# Patient Record
Sex: Male | Born: 1995 | Race: Black or African American | Hispanic: No | Marital: Single | Smoking: Never smoker
Health system: Southern US, Community
[De-identification: ages and names within clinical notes are randomized; demographics above are authoritative.]

## PROBLEM LIST (undated history)

## (undated) DIAGNOSIS — G35 Multiple sclerosis: Secondary | ICD-10-CM

## (undated) DIAGNOSIS — E559 Vitamin D deficiency, unspecified: Secondary | ICD-10-CM

## (undated) DIAGNOSIS — E785 Hyperlipidemia, unspecified: Secondary | ICD-10-CM

## (undated) HISTORY — DX: Multiple sclerosis: G35

## (undated) HISTORY — DX: Vitamin D deficiency, unspecified: E55.9

## (undated) HISTORY — DX: Hyperlipidemia, unspecified: E78.5

---
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
## 2013-07-16 DIAGNOSIS — E559 Vitamin D deficiency, unspecified: Secondary | ICD-10-CM | POA: Insufficient documentation

## 2013-07-16 DIAGNOSIS — E785 Hyperlipidemia, unspecified: Secondary | ICD-10-CM | POA: Insufficient documentation

## 2013-07-18 ENCOUNTER — Encounter: Payer: Self-pay | Admitting: Internal Medicine

## 2014-07-23 ENCOUNTER — Encounter: Payer: Self-pay | Admitting: Internal Medicine

## 2015-02-28 ENCOUNTER — Encounter: Payer: Self-pay | Admitting: Internal Medicine

## 2015-02-28 ENCOUNTER — Ambulatory Visit (INDEPENDENT_AMBULATORY_CARE_PROVIDER_SITE_OTHER): Payer: 59 | Admitting: Internal Medicine

## 2015-02-28 VITALS — BP 122/80 | HR 72 | Temp 97.9°F | Resp 16 | Ht 68.0 in | Wt 169.4 lb

## 2015-02-28 DIAGNOSIS — R7309 Other abnormal glucose: Secondary | ICD-10-CM

## 2015-02-28 DIAGNOSIS — Z6825 Body mass index (BMI) 25.0-25.9, adult: Secondary | ICD-10-CM

## 2015-02-28 DIAGNOSIS — Z111 Encounter for screening for respiratory tuberculosis: Secondary | ICD-10-CM

## 2015-02-28 DIAGNOSIS — E785 Hyperlipidemia, unspecified: Secondary | ICD-10-CM

## 2015-02-28 DIAGNOSIS — E559 Vitamin D deficiency, unspecified: Secondary | ICD-10-CM

## 2015-02-28 DIAGNOSIS — Z0001 Encounter for general adult medical examination with abnormal findings: Secondary | ICD-10-CM | POA: Diagnosis not present

## 2015-02-28 DIAGNOSIS — R6889 Other general symptoms and signs: Secondary | ICD-10-CM

## 2015-02-28 LAB — HEPATIC FUNCTION PANEL
ALBUMIN: 4.7 g/dL (ref 3.6–5.1)
ALT: 14 U/L (ref 9–46)
AST: 10 U/L (ref 10–40)
Alkaline Phosphatase: 54 U/L (ref 40–115)
BILIRUBIN TOTAL: 0.5 mg/dL (ref 0.2–1.2)
Bilirubin, Direct: 0.1 mg/dL (ref <=0.2)
Indirect Bilirubin: 0.4 mg/dL (ref 0.2–1.2)
Total Protein: 7.2 g/dL (ref 6.1–8.1)

## 2015-02-28 LAB — BASIC METABOLIC PANEL WITH GFR
BUN: 14 mg/dL (ref 7–25)
CO2: 25 mmol/L (ref 20–31)
CREATININE: 1.03 mg/dL (ref 0.60–1.35)
Calcium: 9.2 mg/dL (ref 8.6–10.3)
Chloride: 106 mmol/L (ref 98–110)
GFR, Est Non African American: >89 mL/min (ref >=60)
Glucose, Bld: 84 mg/dL (ref 65–99)
POTASSIUM: 4.1 mmol/L (ref 3.5–5.3)
Sodium: 142 mmol/L (ref 135–146)

## 2015-02-28 LAB — CBC WITH DIFFERENTIAL/PLATELET
BASOS PCT: 0 % (ref 0–1)
Basophils Absolute: 0 10*3/uL (ref 0.0–0.1)
EOS ABS: 0.3 10*3/uL (ref 0.0–0.7)
Eosinophils Relative: 6 % — ABNORMAL HIGH (ref 0–5)
HCT: 42.5 % (ref 39.0–52.0)
Hemoglobin: 15.1 g/dL (ref 13.0–17.0)
Lymphocytes Relative: 57 % — ABNORMAL HIGH (ref 12–46)
Lymphs Abs: 2.6 10*3/uL (ref 0.7–4.0)
MCH: 27.9 pg (ref 26.0–34.0)
MCHC: 35.5 g/dL (ref 30.0–36.0)
MCV: 78.4 fL (ref 78.0–100.0)
MPV: 9.4 fL (ref 8.6–12.4)
Monocytes Absolute: 0.4 10*3/uL (ref 0.1–1.0)
Monocytes Relative: 9 % (ref 3–12)
Neutro Abs: 1.3 10*3/uL — ABNORMAL LOW (ref 1.7–7.7)
Neutrophils Relative %: 28 % — ABNORMAL LOW (ref 43–77)
Platelets: 269 10*3/uL (ref 150–400)
RBC: 5.42 MIL/uL (ref 4.22–5.81)
RDW: 14.3 % (ref 11.5–15.5)
WBC: 4.6 10*3/uL (ref 4.0–10.5)

## 2015-02-28 LAB — LIPID PANEL
CHOLESTEROL: 287 mg/dL — AB (ref 125–200)
HDL: 43 mg/dL (ref >=40)
LDL Cholesterol: 209 mg/dL — ABNORMAL HIGH (ref <130)
Total CHOL/HDL Ratio: 6.7 Ratio — ABNORMAL HIGH (ref <=5.0)
Triglycerides: 177 mg/dL — ABNORMAL HIGH (ref <150)
VLDL: 35 mg/dL — ABNORMAL HIGH (ref <30)

## 2015-02-28 LAB — TSH: TSH: 3.243 u[IU]/mL (ref 0.350–4.500)

## 2015-02-28 NOTE — Patient Instructions (Addendum)
High Cholesterol High cholesterol refers to having a high level of cholesterol in your blood. Cholesterol is a white, waxy, fat-like protein that your body needs in small amounts. Your liver makes all the cholesterol you need. Excess cholesterol comes from the food you eat. Cholesterol travels in your bloodstream through your blood vessels. If you have high cholesterol, deposits (plaque) may build up on the walls of your blood vessels. This makes the arteries narrower and stiffer. Plaque increases your risk of heart attack and stroke. Work with your health care provider to keep your cholesterol levels in a healthy range. RISK FACTORS Several things can make you more likely to have high cholesterol. These include:   Eating foods high in animal fat (saturated fat) or cholesterol.  Being overweight.  Not getting enough exercise.  Having a family history of high cholesterol. SIGNS AND SYMPTOMS High cholesterol does not cause symptoms. DIAGNOSIS  Your health care provider can do a blood test to check whether you have high cholesterol. If you are older than 20, your health care provider may check your cholesterol every 4-6 years. You may be checked more often if you already have high cholesterol or other risk factors for heart disease. The blood test for cholesterol measures the following:  Bad cholesterol (LDL cholesterol). This is the type of cholesterol that causes heart disease. This number should be less than 100.  Good cholesterol (HDL cholesterol). This type helps protect against heart disease. A healthy level of HDL cholesterol is 60 or higher.  Total cholesterol. This is the combined number of LDL cholesterol and HDL cholesterol. A healthy number is less than 200. TREATMENT  High cholesterol can be treated with diet changes, lifestyle changes, and medicine.   Diet changes may include eating more whole grains, fruits, vegetables, nuts, and fish. You may also have to cut back on red meat  and foods with a lot of added sugar.  Lifestyle changes may include getting at least 40 minutes of aerobic exercise three times a week. Aerobic exercises include walking, biking, and swimming. Aerobic exercise along with a healthy diet can help you maintain a healthy weight. Lifestyle changes may also include quitting smoking.  If diet and lifestyle changes are not enough to lower your cholesterol, your health care provider may prescribe a statin medicine. This medicine has been shown to lower cholesterol and also lower the risk of heart disease. HOME CARE INSTRUCTIONS  Only take over-the-counter or prescription medicines as directed by your health care provider.   Follow a healthy diet as directed by your health care provider. For instance:   Eat chicken (without skin), fish, veal, shellfish, ground [place] breast, and round or loin cuts of red meat.  Do not eat fried foods and fatty meats, such as hot dogs and salami.   Eat plenty of fruits, such as apples.   Eat plenty of vegetables, such as broccoli, potatoes, and carrots.   Eat beans, peas, and lentils.   Eat grains, such as barley, rice, couscous, and bulgur wheat.   Eat pasta without cream sauces.   Use skim or nonfat milk and low-fat or nonfat yogurt and cheeses. Do not eat or drink whole milk, cream, ice cream, egg yolks, and hard cheeses.   Do not eat stick margarine or tub margarines that contain trans fats (also called partially hydrogenated oils).   Do not eat cakes, cookies, crackers, or other baked goods that contain trans fats.   Do not eat saturated tropical oils, such as  coconut and palm oil.   Exercise as directed by your health care provider. Increase your activity level with activities such as gardening or walking.   Keep all follow-up appointments.  SEEK MEDICAL CARE IF:  You are struggling to maintain a healthy diet or weight.  You need help starting an exercise program.  You need help to  stop smoking.  Recommend Adult Low dose Aspirin or coated  Aspirin 81 mg daily   To reduce risk of Colon Cancer 20 %,   Skin Cancer 26 % ,   Melanoma 46%   and   Pancreatic cancer 60% ++++++++++++++++++ Vitamin D goal is between 70-100.   Please make sure that you are taking your Vitamin D as directed.   It is very important as a natural anti-inflammatory   helping hair, skin, and nails, as well as reducing stroke and heart attack risk.   It helps your bones and helps with mood.  It also decreases numerous cancer risks so please take it as directed.   Low Vit D is associated with a 200-300% higher risk for CANCER   and 200-300% higher risk for HEART   ATTACK  &  STROKE.   .....................................Marland Kitchen  It is also associated with higher death rate at younger ages,   autoimmune diseases like Rheumatoid arthritis, Lupus, Multiple Sclerosis.     Also many other serious conditions, like depression, Alzheimer's  Dementia, infertility, muscle aches, fatigue, fibromyalgia - just to name a few.  +++++++++++++++++++  Recommend the book "The END of DIETING" by Dr Excell Seltzer   & the book "The END of DIABETES " by Dr Excell Seltzer  At [email or web address] - get book & Audio CD's     Being diabetic has a  300% increased risk for heart attack, stroke, cancer, and alzheimer- type vascular dementia. It is very important that you work harder with diet by avoiding all foods that are white. Avoid white rice (brown & wild rice is OK), white potatoes (sweetpotatoes in moderation is OK), White bread or wheat bread or anything made out of white flour like bagels, donuts, rolls, buns, biscuits, cakes, pastries, cookies, pizza crust, and pasta (made from white flour & egg whites) - vegetarian pasta or spinach or wheat pasta is OK. Multigrain breads like Arnold's or [employer], or multigrain sandwich thins or flatbreads.  Diet, exercise and weight loss can reverse and cure diabetes in the  early stages.  Diet, exercise and weight loss is very important in the control and prevention of complications of diabetes which affects every system in your body, ie. Brain - dementia/stroke, eyes - glaucoma/blindness, heart - heart attack/heart failure, kidneys - dialysis, stomach - gastric paralysis, intestines - malabsorption, nerves - severe painful neuritis, circulation - gangrene & loss of a leg(s), and finally cancer and Alzheimers.    I recommend avoid fried & greasy foods,  sweets/candy, white rice (brown or wild rice or Quinoa is OK), white potatoes (sweet potatoes are OK) - anything made from white flour - bagels, doughnuts, rolls, buns, biscuits,white and wheat breads, pizza crust and traditional pasta made of white flour & egg white(vegetarian pasta or spinach or wheat pasta is OK).  Multi-grain bread is OK - like multi-grain flat bread or sandwich thins. Avoid alcohol in excess. Exercise is also important.    Eat all the vegetables you want - avoid meat, especially red meat and dairy - especially cheese.  Cheese is the most concentrated form of trans-fats which is the worst thing  to clog up our arteries. Veggie cheese is OK which can be found in the fresh produce section at [facility] or [employer] or [employer]  ++++++++++++++++++++++++++   Preventive Care for Adults  A healthy lifestyle and preventive care can promote health and wellness. Preventive health guidelines for men include the following key practices:  A routine yearly physical is a good way to check with your health care provider about your health and preventative screening. It is a chance to share any concerns and updates on your health and to receive a thorough exam.  Visit your dentist for a routine exam and preventative care every 6 months. Brush your teeth twice a day and floss once a day. Good oral hygiene prevents tooth decay and gum disease.  The frequency of eye exams is based on your age, health, family  medical history, use of contact lenses, and other factors. Follow your health care provider's recommendations for frequency of eye exams.  Eat a healthy diet. Foods such as vegetables, fruits, whole grains, low-fat dairy products, and lean protein foods contain the nutrients you need without too many calories. Decrease your intake of foods high in solid fats, added sugars, and salt. Eat the right amount of calories for you.Get information about a proper diet from your health care provider, if necessary.  Regular physical exercise is one of the most important things you can do for your health. Most adults should get at least 150 minutes of moderate-intensity exercise (any activity that increases your heart rate and causes you to sweat) each week. In addition, most adults need muscle-strengthening exercises on 2 or more days a week.  Maintain a healthy weight. The body mass index (BMI) is a screening tool to identify possible weight problems. It provides an estimate of body fat based on height and weight. Your health care provider can find your BMI and can help you achieve or maintain a healthy weight.For adults 20 years and older:  A BMI below 18.5 is considered underweight.  A BMI of 18.5 to 24.9 is normal.  A BMI of 25 to 29.9 is considered overweight.  A BMI of 30 and above is considered obese.  Maintain normal blood lipids and cholesterol levels by exercising and minimizing your intake of saturated fat. Eat a balanced diet with plenty of fruit and vegetables. Blood tests for lipids and cholesterol should begin at age 67 and be repeated every 5 years. If your lipid or cholesterol levels are high, you are over 50, or you are at high risk for heart disease, you may need your cholesterol levels checked more frequently.Ongoing high lipid and cholesterol levels should be treated with medicines if diet and exercise are not working.  If you smoke, find out from your health care provider how to quit.  If you do not use tobacco, do not start.  Lung cancer screening is recommended for adults aged 37-80 years who are at high risk for developing lung cancer because of a history of smoking. A yearly low-dose CT scan of the lungs is recommended for people who have at least a 30-pack-year history of smoking and are a current smoker or have quit within the past 15 years. A pack year of smoking is smoking an average of 1 pack of cigarettes a day for 1 year (for example: 1 pack a day for 30 years or 2 packs a day for 15 years). Yearly screening should continue until the smoker has stopped smoking for at least 15 years. Yearly screening  should be stopped for people who develop a health problem that would prevent them from having lung cancer treatment.  If you choose to drink alcohol, do not have more than 2 drinks per day. One drink is considered to be 12 ounces (355 mL) of beer, 5 ounces (148 mL) of wine, or 1.5 ounces (44 mL) of liquor.  High blood pressure causes heart disease and increases the risk of stroke. Your blood pressure should be checked. Ongoing high blood pressure should be treated with medicines, if weight loss and exercise are not effective.  If you are 21-64 years old, ask your health care provider if you should take aspirin to prevent heart disease.  Diabetes screening involves taking a blood sample to check your fasting blood sugar level. Testing should be considered at a younger age or be carried out more frequently if you are overweight and have at least 1 risk factor for diabetes.  Colorectal cancer can be detected and often prevented. Most routine colorectal cancer screening begins at the age of 43 and continues through age 71. However, your health care provider may recommend screening at an earlier age if you have risk factors for colon cancer. On a yearly basis, your health care provider may provide home test kits to check for hidden blood in the stool. Use of a small camera at the end  of a tube to directly examine the colon (sigmoidoscopy or colonoscopy) can detect the earliest forms of colorectal cancer. Talk to your health care provider about this at age 50, when routine screening begins. Direct exam of the colon should be repeated every 5-10 years through age 33, unless early forms of precancerous polyps or small growths are found.  Screening for abdominal aortic aneurysm (AAA)  are recommended for persons over age 85 who have history of hypertensionor who are current or former smokers.  Talk with your health care provider about prostate cancer screening.  Testicular cancer screening is recommended for adult males. Screening includes self-exam, a health care provider exam, and other screening tests. Consult with your health care provider about any symptoms you have or any concerns you have about testicular cancer.  Use sunscreen. Apply sunscreen liberally and repeatedly throughout the day. You should seek shade when your shadow is shorter than you. Protect yourself by wearing long sleeves, pants, a wide-brimmed hat, and sunglasses year round, whenever you are outdoors.  Once a month, do a whole-body skin exam, using a mirror to look at the skin on your back. Tell your health care provider about new moles, moles that have irregular borders, moles that are larger than a pencil eraser, or moles that have changed in shape or color.  Stay current with required vaccines (immunizations).  Influenza vaccine. All adults should be immunized every year.  Tetanus, diphtheria, and acellular pertussis (Td, Tdap) vaccine. An adult who has not previously received Tdap or who does not know his vaccine status should receive 1 dose of Tdap. This initial dose should be followed by tetanus and diphtheria toxoids (Td) booster doses every 10 years. Adults with an unknown or incomplete history of completing a 3-dose immunization series with Td-containing vaccines should begin or complete a primary  immunization series including a Tdap dose. Adults should receive a Td booster every 10 years.  Zoster vaccine. One dose is recommended for adults aged 97 years or older unless certain conditions are present.    Pneumococcal 13-valent conjugate (PCV13) vaccine. When indicated, a person who is uncertain of his immunization  history and has no record of immunization should receive the PCV13 vaccine. An adult aged 68 years or older who has certain medical conditions and has not been previously immunized should receive 1 dose of PCV13 vaccine. This PCV13 should be followed with a dose of pneumococcal polysaccharide (PPSV23) vaccine. The PPSV23 vaccine dose should be obtained at least 8 weeks after the dose of PCV13 vaccine. An adult aged 67 years or older who has certain medical conditions and previously received 1 or more doses of PPSV23 vaccine should receive 1 dose of PCV13. The PCV13 vaccine dose should be obtained 1 or more years after the last PPSV23 vaccine dose.    Pneumococcal polysaccharide (PPSV23) vaccine. When PCV13 is also indicated, PCV13 should be obtained first. All adults aged 58 years and older should be immunized. An adult younger than age 70 years who has certain medical conditions should be immunized. Any person who resides in a nursing home or long-term care facility should be immunized. An adult smoker should be immunized. People with an immunocompromised condition and certain other conditions should receive both PCV13 and PPSV23 vaccines. People with human immunodeficiency virus (HIV) infection should be immunized as soon as possible after diagnosis. Immunization during chemotherapy or radiation therapy should be avoided. Routine use of PPSV23 vaccine is not recommended for American Indians, [place] Natives, or people younger than 65 years unless there are medical conditions that require PPSV23 vaccine. When indicated, people who have unknown immunization and have no record of  immunization should receive PPSV23 vaccine. One-time revaccination 5 years after the first dose of PPSV23 is recommended for people aged 19-64 years who have chronic kidney failure, nephrotic syndrome, asplenia, or immunocompromised conditions. People who received 1-2 doses of PPSV23 before age 44 years should receive another dose of PPSV23 vaccine at age 37 years or later if at least 5 years have passed since the previous dose. Doses of PPSV23 are not needed for people immunized with PPSV23 at or after age 4 years.  Hepatitis A vaccine. Adults who wish to be protected from this disease, have certain high-risk conditions, work with hepatitis A-infected animals, work in hepatitis A research labs, or travel to or work in countries with a high rate of hepatitis A should be immunized. Adults who were previously unvaccinated and who anticipate close contact with an international adoptee during the first 60 days after arrival in the [place] from a country with a high rate of hepatitis A should be immunized.  Hepatitis B vaccine. Adults should be immunized if they wish to be protected from this disease, have certain high-risk conditions, may be exposed to blood or other infectious body fluids, are household contacts or sex partners of hepatitis B positive people, are clients or workers in certain care facilities, or travel to or work in countries with a high rate of hepatitis B.  Preventive Service / Frequency  Ages 50 to 55  Blood pressure check.  Lipid and cholesterol check.  Hepatitis C blood test.** / For any individual with known risks for hepatitis C.  Skin self-exam. / Monthly.  Influenza vaccine. / Every year.  Tetanus, diphtheria, and acellular pertussis (Tdap, Td) vaccine.** / Consult your health care provider. 1 dose of Td every 10 years.  HPV vaccine. / 3 doses over 6 months, if 50 or younger.  Measles, mumps, rubella (MMR) vaccine.** / You need at least 1 dose of MMR if you were  born in 1957 or later. You may also need a second dose.  Pneumococcal 13-valent conjugate (PCV13) vaccine.** / Consult your health care provider.  Pneumococcal polysaccharide (PPSV23) vaccine.** / 1 to 2 doses if you smoke cigarettes or if you have certain conditions.  Meningococcal vaccine.** / 1 dose if you are age 34 to 61 years and a Market researcher living in a residence hall, or have one of several medical conditions. You may also need additional booster doses.  Hepatitis A vaccine.** / Consult your health care provider.  Hepatitis B vaccine.** / Consult your health care provider.   Atherosclerosis Atherosclerosis, or hardening of the arteries, is the buildup of plaque within the major arteries in the body. Plaque is made up of fats (lipids), cholesterol, calcium, and fibrous tissue. Plaque can narrow or block blood flow within an artery. Plaque can break off and cause damage to the affected organ. Plaque can also "rupture." When plaque ruptures within an artery, a clot can form, causing a sudden (acute) blockage of the artery. Untreated atherosclerosis can cause serious health problems or death.  RISK FACTORS  High cholesterol levels.  Smoking.  Obesity.  Lack of activity or exercise.  Eating a diet high in saturated fat.  Family history.  Diabetes. SIGNS AND SYMPTOMS  Symptoms of atherosclerosis can occur when blood flow to an artery is slowed or blocked. Severity and onset of symptoms depends on how extensive the narrowing or blockage is. A sudden plaque rupture can bring immediate, life-threatening symptoms. Atherosclerosis can affect different arteries in the body, for example:  Coronary arteries. The coronary arteries supply the heart with blood. When the coronary arteries are narrowed or blocked from atherosclerosis, this is known as coronary artery disease (CAD). CAD can cause a heart attack. Common heart attack symptoms include:  Chest pain or pain that  radiates to the neck, arm, jaw, or in the upper, middle back (mid-scapular pain).  Shortness of breath without cause.  Profuse sweating while at rest.  Irregular heartbeats.  Nausea or gastrointestinal upset.  Carotid arteries. The carotid arteries supply the brain with blood. They are located on each side of your neck. When blood flow to these arteries is slowed or blocked, a transient ischemic attack (TIA) or stroke can occur. A TIA is considered a "mini-stroke" or "warning stroke." TIA symptoms are the same as stroke symptoms, but they are temporary and last less than 24 hours. A stroke can cause permanent damage or death. Common TIA and stroke symptoms include:  Sudden numbness or weakness to one side of your body, such as the face, arm, or leg.  Sudden confusion or trouble speaking or understanding.  Sudden trouble seeing out of one or both eyes.  Sudden trouble walking, loss of balance, or dizziness.  Sudden, severe headache with no known cause.  Arteries in the legs. When arteries in the lower legs become narrowed or blocked, this is known as peripheral vascular disease (PVD). PVD can cause a symptom called claudication. Claudication is pain or a burning feeling in your legs when walking or exercising and usually goes away with rest. Very severe PVD can cause pain in your legs while at rest.  Renal arteries. The renal arteries supply the kidneys with blood. Blockage of the renal arteries can cause a decline in kidney function or high blood pressure (hypertension).  Gastrointestinal arteries (mesenteric circulation). Abdominal pain may occur after eating. DIAGNOSIS  Your health care provider may perform the following tests to diagnose atherosclerosis:  Blood tests.  Stress test.  Echocardiogram.  Nuclear scan.  Ankle/brachial index.  Ultrasonography.  Computed tomography (CT) scan.  Angiography. TREATMENT  Atherosclerosis treatment includes the  following:  Lifestyle changes such as:  Quitting smoking. Your health care provider can help you with smoking cessation.  Eating a diet low in saturated fat. A registered dietitian can educate you on healthy food options, such as helping you understand the difference between good fat and bad fat.  Following an exercise program approved by your health care provider.  Maintaining a healthy weight. Lose weight as approved by your health care provider.  Have your cholesterol levels checked as directed by your health care provider.  Medicines. Cholesterol medicines can help slow or stop the progression of atherosclerosis.  Different procedural or surgical interventions to treat atherosclerosis include:  Balloon angioplasty. The technical name for balloon angioplasty is percutaneous transluminal angioplasty (PTA). In this procedure, a catheter with a small balloon at the tip is inserted through the blocked or narrowed artery. The balloon is then inflated. When the balloon is inflated, the fatty plaque is compressed against the artery wall, allowing better blood flow within the artery.  Balloon angioplasty and stenting. In this procedure, balloon angioplasty is combined with a stenting procedure. A stent is a small, metal mesh tube that keeps the artery open. After the artery is opened up by the balloon technique, the stent is then deployed. The stent is permanent.  Open heart surgery or bypass surgery. To perform this type of surgery, a healthy vessel is first "harvested" from either the leg or arm. The harvested vessel is then used to "bypass" the blocked atherosclerotic vessel so new blood flow can be established.  Atherectomy. Atherectomy is a procedure that uses a catheter with a sharp blade to remove plaque from an artery. A chamber in the catheter collects the plaque.  Endarterectomy. An endarterectomy is a surgical procedure where a surgeon removes plaque from an artery.  Amputation.  When blockages in the lower legs are very severe and circulation cannot be restored, amputation may be required. SEEK IMMEDIATE MEDICAL CARE IF:  You are having heart attack symptoms, such as:  Chest pain or pain that radiates to the neck, arm, jaw, or in the upper, middle back (mid-scapular pain).  Shortness of breath without cause.  Profuse sweating while at rest.  Irregular heartbeats.  Nausea or gastrointestinal upset.  You are having stroke symptoms, such as sudden:  Numbness or weakness to one side of your body, such as the face, arm, or leg.  Confusion or trouble speaking or understanding.  Trouble seeing out of one or both eyes.  Trouble walking, loss of balance, or dizziness.  Severe headache with no known cause.  Your hands or feet are bluish, cold, or you have pain in them.  You have bad abdominal pain after eating. Symptoms of heart attack or stroke may represent a serious problem that is an emergency. Do not wait to see if the symptoms will go away. Get medical help right away. Call your local emergency services (911 in the [place]

## 2015-02-28 NOTE — Progress Notes (Signed)
Patient ID: Willie Archer, male   DOB: [birth year], 19 y.o.   MRN: [id number]  Annual Preventative Comprehensive Examination   This very nice 19 y.o. SBM presents for complete physical.  Patient has hx/o severe HLD with TC measuring ~300 & LDL's ~210- 2106 in 2010 & 2011. Patient has sporadically taken Crestor , but doesn't seem to comprehend the severity and consequences of his poor compliance for treatment. He does report fair attempts at low chol diet . Does not exercise.   Finally, patient has history of Vitamin D Deficiency of "7" in [address] and likewise has only taken his Vit D supplements sporadically.    Medication Sig  . Cholecalciferol (VITAMIN D PO) Take 5,000 Int'l Units by mouth daily. Taking 2 daily   No Known Allergies  Past Medical History  Diagnosis Date  . Hyperlipidemia   . Vitamin D deficiency    Immunization History  Administered Date(s) Administered  . HPV Quadrivalent 07/09/2011  . PPD Test 02/28/2015  . Tdap 02/21/2009   Past Surgical History  Procedure Laterality Date  . Achilles tendon surgery Bilateral 2007    Dr Cheryll Cockayne    Family History  Problem Relation Age of Onset  . Hypertension Mother   . Diabetes Father     Social History   Social History  . Marital Status: Single    Spouse Name: N/A  . Number of Children: N/A  . Years of Education: N/A   Occupational History  . Completed 2 yr degree at [employer] and is entering a Engineer, materials track at [employer] History Main Topics  . Smoking status: Never Smoker   . Smokeless tobacco: Never Used  . Alcohol Use: 0.0 oz/week    0 Standard drinks or equivalent per week     Comment: occasional  . Drug Use: No  . Sexual Activity: Active    ROS Constitutional: Denies fever, chills, weight loss/gain, headaches, insomnia, fatigue, night sweats, and change in appetite. Eyes: Denies redness, blurred vision, diplopia, discharge, itchy, watery eyes.  ENT: Denies discharge, congestion, post  nasal drip, epistaxis, sore throat, earache, hearing loss, dental pain, Tinnitus, Vertigo, Sinus pain, snoring.  Cardio: Denies chest pain, palpitations, irregular heartbeat, syncope, dyspnea, diaphoresis, orthopnea, PND, claudication, edema Respiratory: denies cough, dyspnea, DOE, pleurisy, hoarseness, laryngitis, wheezing.  Gastrointestinal: Denies dysphagia, heartburn, reflux, water brash, pain, cramps, nausea, vomiting, bloating, diarrhea, constipation, hematemesis, melena, hematochezia, jaundice, hemorrhoids Genitourinary: Denies dysuria, frequency, urgency, nocturia, hesitancy, discharge, hematuria, flank pain Breast: Breast lumps, nipple discharge, bleeding.  Musculoskeletal: Denies arthralgia, myalgia, stiffness, Jt. Swelling, pain, limp, and strain/sprain. Skin: Denies puritis, rash, hives, warts, acne, eczema, changing in skin lesion Neuro: Weakness, tremor, incoordination, spasms, paresthesia, pain Psychiatric: Denies confusion, memory loss, sensory loss. Endocrine: Denies change in weight, skin, hair change, nocturia, and paresthesia, diabetic polys, visual blurring, hyper /hypo glycemic episodes.  Heme/Lymph: No excessive bleeding, bruising, enlarged lymph nodes.   Physical Exam  BP 122/80 mmHg  Pulse 72  Temp(Src) 97.9 F (36.6 C)  Resp 16  Ht  (1.727 m)  Wt 169 lb 6.4 oz (76.839 kg)  BMI 25.76 kg/m2  General Appearance: Well nourished and in no apparent distress. Eyes: PERRLA, EOMs, conjunctiva no swelling or erythema, normal fundi and vessels. Sinuses: No frontal/maxillary tenderness ENT/Mouth: EACs patent / TMs  nl. Nares clear without erythema, swelling, mucoid exudates. Oral hygiene is good. No erythema, swelling, or exudate. Tongue normal, non-obstructing. Tonsils not swollen or erythematous. Hearing normal.  Neck: Supple, thyroid  normal. No bruits, nodes or JVD. Respiratory: Respiratory effort normal.  BS equal and clear bilateral without rales, rhonci,  wheezing or stridor. Cardio: Heart sounds are normal with regular rate and rhythm and no murmurs, rubs or gallops. Peripheral pulses are normal and equal bilaterally without edema. No aortic or femoral bruits. Chest: symmetric with normal excursions and percussion. Breasts: Symmetric, without lumps, nipple discharge, retractions, or fibrocystic changes.  Abdomen: Flat, soft, with bowl sounds. Nontender, no guarding, rebound, hernias, masses, or organomegaly.  Lymphatics: Non tender without lymphadenopathy.  Genitourinary: No hernia. Nl male.  Musculoskeletal: Full ROM all peripheral extremities, joint stability, 5/5 strength, and normal gait. Skin: Warm and dry without rashes, lesions, cyanosis, clubbing or  ecchymosis.  Neuro: Cranial nerves intact, reflexes equal bilaterally. Normal muscle tone, no cerebellar symptoms. Sensation intact.  Pysch: Awake and oriented X 3, normal affect, Insight and Judgment appropriate.   Assessment and Plan  1. Encounter for general adult medical examination with abnormal findings  - Urine Microscopic - CBC with Differential/Platelet - BASIC METABOLIC PANEL WITH GFR - Hepatic function panel - Lipid panel - TSH - Hemoglobin A1c - Insulin, random - Vit D  25 hydroxy   2. Hyperlipidemia  - Lipid panel  - scheduling 3 & 6 mo OV anticipating he will need to restart Crestor  3. Vitamin D deficiency  - Vit D  25 hydroxy   4. Abnormal glucose   5. BMI 25.0-25.9,adult   6. Screening examination for pulmonary tuberculosis  - PPD  Continue prudent diet as discussed, weight control, regular exercise, and medications. Routine screening labs and tests as requested with regular follow-up as recommended.

## 2015-03-01 ENCOUNTER — Other Ambulatory Visit: Payer: Self-pay | Admitting: Internal Medicine

## 2015-03-01 DIAGNOSIS — E782 Mixed hyperlipidemia: Secondary | ICD-10-CM

## 2015-03-01 LAB — URINALYSIS, MICROSCOPIC ONLY
Bacteria, UA: NONE SEEN [HPF]
Casts: NONE SEEN [LPF]
Crystals: NONE SEEN [HPF]
SQUAMOUS EPITHELIAL / LPF: NONE SEEN [HPF] (ref <=5)
WBC, UA: NONE SEEN WBC/HPF (ref <=5)
Yeast: NONE SEEN [HPF]

## 2015-03-01 LAB — HEMOGLOBIN A1C
Hgb A1c MFr Bld: 5.5 % (ref <5.7)
Mean Plasma Glucose: 111 mg/dL (ref <117)

## 2015-03-01 LAB — INSULIN, RANDOM: INSULIN: 8.2 u[IU]/mL (ref 2.0–19.6)

## 2015-03-01 LAB — VITAMIN D 25 HYDROXY (VIT D DEFICIENCY, FRACTURES): Vit D, 25-Hydroxy: 10 ng/mL — ABNORMAL LOW (ref 30–100)

## 2015-03-01 MED ORDER — ROSUVASTATIN CALCIUM 40 MG PO TABS: ORAL_TABLET | ORAL | Status: DC

## 2015-06-06 ENCOUNTER — Ambulatory Visit: Payer: Self-pay | Admitting: Internal Medicine

## 2015-09-12 ENCOUNTER — Ambulatory Visit: Payer: Self-pay | Admitting: Internal Medicine

## 2016-03-20 ENCOUNTER — Ambulatory Visit (INDEPENDENT_AMBULATORY_CARE_PROVIDER_SITE_OTHER): Payer: 59 | Admitting: Internal Medicine

## 2016-03-20 ENCOUNTER — Other Ambulatory Visit: Payer: Self-pay | Admitting: Internal Medicine

## 2016-03-20 VITALS — BP 128/76 | HR 76 | Temp 97.8°F | Resp 16 | Ht 68.25 in | Wt 189.2 lb

## 2016-03-20 DIAGNOSIS — E559 Vitamin D deficiency, unspecified: Secondary | ICD-10-CM

## 2016-03-20 DIAGNOSIS — E785 Hyperlipidemia, unspecified: Secondary | ICD-10-CM

## 2016-03-20 DIAGNOSIS — Z0000 Encounter for general adult medical examination without abnormal findings: Secondary | ICD-10-CM | POA: Diagnosis not present

## 2016-03-20 DIAGNOSIS — E782 Mixed hyperlipidemia: Secondary | ICD-10-CM

## 2016-03-20 DIAGNOSIS — Z1212 Encounter for screening for malignant neoplasm of rectum: Secondary | ICD-10-CM

## 2016-03-20 DIAGNOSIS — Z136 Encounter for screening for cardiovascular disorders: Secondary | ICD-10-CM | POA: Diagnosis not present

## 2016-03-20 DIAGNOSIS — R030 Elevated blood-pressure reading, without diagnosis of hypertension: Secondary | ICD-10-CM

## 2016-03-20 DIAGNOSIS — Z79899 Other long term (current) drug therapy: Secondary | ICD-10-CM

## 2016-03-20 DIAGNOSIS — IMO0001 Reserved for inherently not codable concepts without codable children: Secondary | ICD-10-CM

## 2016-03-20 DIAGNOSIS — Z0001 Encounter for general adult medical examination with abnormal findings: Secondary | ICD-10-CM

## 2016-03-20 DIAGNOSIS — I10 Essential (primary) hypertension: Secondary | ICD-10-CM

## 2016-03-20 DIAGNOSIS — R5383 Other fatigue: Secondary | ICD-10-CM

## 2016-03-20 DIAGNOSIS — R7309 Other abnormal glucose: Secondary | ICD-10-CM | POA: Insufficient documentation

## 2016-03-20 DIAGNOSIS — Z111 Encounter for screening for respiratory tuberculosis: Secondary | ICD-10-CM | POA: Diagnosis not present

## 2016-03-20 LAB — LIPID PANEL
CHOL/HDL RATIO: 8.2 ratio — AB (ref <=5.0)
CHOLESTEROL: 311 mg/dL — AB (ref 125–200)
HDL: 38 mg/dL — ABNORMAL LOW (ref >=40)
Triglycerides: 617 mg/dL — ABNORMAL HIGH (ref <150)

## 2016-03-20 LAB — CBC WITH DIFFERENTIAL/PLATELET
BASOS ABS: 0 {cells}/uL (ref 0–200)
Basophils Relative: 0 %
EOS PCT: 6 %
Eosinophils Absolute: 312 cells/uL (ref 15–500)
HCT: 42.1 % (ref 38.5–50.0)
Hemoglobin: 14.5 g/dL (ref 13.2–17.1)
Lymphocytes Relative: 52 %
Lymphs Abs: 2704 cells/uL (ref 850–3900)
MCH: 28 pg (ref 27.0–33.0)
MCHC: 34.4 g/dL (ref 32.0–36.0)
MCV: 81.3 fL (ref 80.0–100.0)
MPV: 10.5 fL (ref 7.5–12.5)
Monocytes Absolute: 416 cells/uL (ref 200–950)
Monocytes Relative: 8 %
NEUTROS ABS: 1768 {cells}/uL (ref 1500–7800)
Neutrophils Relative %: 34 %
Platelets: 299 10*3/uL (ref 140–400)
RBC: 5.18 MIL/uL (ref 4.20–5.80)
RDW: 13.6 % (ref 11.0–15.0)
WBC: 5.2 10*3/uL (ref 3.8–10.8)

## 2016-03-20 LAB — BASIC METABOLIC PANEL WITH GFR
BUN: 16 mg/dL (ref 7–25)
CALCIUM: 9.3 mg/dL (ref 8.6–10.3)
CO2: 24 mmol/L (ref 20–31)
CREATININE: 0.97 mg/dL (ref 0.60–1.35)
Chloride: 103 mmol/L (ref 98–110)
GFR, Est African American: >89 mL/min (ref >=60)
GFR, Est Non African American: >89 mL/min (ref >=60)
Glucose, Bld: 102 mg/dL — ABNORMAL HIGH (ref 65–99)
Potassium: 3.7 mmol/L (ref 3.5–5.3)
SODIUM: 139 mmol/L (ref 135–146)

## 2016-03-20 LAB — HEPATIC FUNCTION PANEL
ALT: 18 U/L (ref 9–46)
AST: 14 U/L (ref 10–40)
Albumin: 4.6 g/dL (ref 3.6–5.1)
Alkaline Phosphatase: 55 U/L (ref 40–115)
BILIRUBIN DIRECT: 0 mg/dL (ref <=0.2)
BILIRUBIN TOTAL: 0.3 mg/dL (ref 0.2–1.2)
Indirect Bilirubin: 0.3 mg/dL (ref 0.2–1.2)
Total Protein: 6.9 g/dL (ref 6.1–8.1)

## 2016-03-20 LAB — TSH: TSH: 2.47 m[IU]/L (ref 0.40–4.50)

## 2016-03-20 LAB — IRON AND TIBC
%SAT: 18 % (ref 15–60)
Iron: 50 ug/dL (ref 50–195)
TIBC: 280 ug/dL (ref 250–425)
UIBC: 230 ug/dL (ref 125–400)

## 2016-03-20 LAB — MAGNESIUM: MAGNESIUM: 1.9 mg/dL (ref 1.5–2.5)

## 2016-03-20 MED ORDER — ROSUVASTATIN CALCIUM 40 MG PO TABS: ORAL_TABLET | ORAL | 1 refills | Status: DC

## 2016-03-20 NOTE — Patient Instructions (Signed)
Preventive Care for Adults  A healthy lifestyle and preventive care can promote health and wellness. Preventive health guidelines for men include the following key practices:  A routine yearly physical is a good way to check with your health care provider about your health and preventative screening. It is a chance to share any concerns and updates on your health and to receive a thorough exam.  Visit your dentist for a routine exam and preventative care every 6 months. Brush your teeth twice a day and floss once a day. Good oral hygiene prevents tooth decay and gum disease.  The frequency of eye exams is based on your age, health, family medical history, use of contact lenses, and other factors. Follow your health care provider's recommendations for frequency of eye exams.  Eat a healthy diet. Foods such as vegetables, fruits, whole grains, low-fat dairy products, and lean protein foods contain the nutrients you need without too many calories. Decrease your intake of foods high in solid fats, added sugars, and salt. Eat the right amount of calories for you.Get information about a proper diet from your health care provider, if necessary.  Regular physical exercise is one of the most important things you can do for your health. Most adults should get at least 150 minutes of moderate-intensity exercise (any activity that increases your heart rate and causes you to sweat) each week. In addition, most adults need muscle-strengthening exercises on 2 or more days a week.  Maintain a healthy weight. The body mass index (BMI) is a screening tool to identify possible weight problems. It provides an estimate of body fat based on height and weight. Your health care provider can find your BMI and can help you achieve or maintain a healthy weight.For adults 20 years and older:  A BMI below 18.5 is considered underweight.  A BMI of 18.5 to 24.9 is normal.  A BMI of 25 to 29.9 is considered overweight.  A  BMI of 30 and above is considered obese.  Maintain normal blood lipids and cholesterol levels by exercising and minimizing your intake of saturated fat. Eat a balanced diet with plenty of fruit and vegetables. Blood tests for lipids and cholesterol should begin at age 20 and be repeated every 5 years. If your lipid or cholesterol levels are high, you are over 50, or you are at high risk for heart disease, you may need your cholesterol levels checked more frequently.Ongoing high lipid and cholesterol levels should be treated with medicines if diet and exercise are not working.  If you smoke, find out from your health care provider how to quit. If you do not use tobacco, do not start.  Lung cancer screening is recommended for adults aged 55-80 years who are at high risk for developing lung cancer because of a history of smoking. A yearly low-dose CT scan of the lungs is recommended for people who have at least a 30-pack-year history of smoking and are a current smoker or have quit within the past 15 years. A pack year of smoking is smoking an average of 1 pack of cigarettes a day for 1 year (for example: 1 pack a day for 30 years or 2 packs a day for 15 years). Yearly screening should continue until the smoker has stopped smoking for at least 15 years. Yearly screening should be stopped for people who develop a health problem that would prevent them from having lung cancer treatment.  If you choose to drink alcohol, do not have more   than 2 drinks per day. One drink is considered to be 12 ounces (355 mL) of beer, 5 ounces (148 mL) of wine, or 1.5 ounces (44 mL) of liquor.  High blood pressure causes heart disease and increases the risk of stroke. Your blood pressure should be checked. Ongoing high blood pressure should be treated with medicines, if weight loss and exercise are not effective.  If you are 38-65 years old, ask your health care provider if you should take aspirin to prevent heart  disease.  Diabetes screening involves taking a blood sample to check your fasting blood sugar level. Testing should be considered at a younger age or be carried out more frequently if you are overweight and have at least 1 risk factor for diabetes.  Colorectal cancer can be detected and often prevented. Most routine colorectal cancer screening begins at the age of 56 and continues through age 26. However, your health care provider may recommend screening at an earlier age if you have risk factors for colon cancer. On a yearly basis, your health care provider may provide home test kits to check for hidden blood in the stool. Use of a small camera at the end of a tube to directly examine the colon (sigmoidoscopy or colonoscopy) can detect the earliest forms of colorectal cancer. Talk to your health care provider about this at age 96, when routine screening begins. Direct exam of the colon should be repeated every 5-10 years through age 69, unless early forms of precancerous polyps or small growths are found.  Screening for abdominal aortic aneurysm (AAA)  are recommended for persons over age 25 who have history of hypertensionor who are current or former smokers.  Talk with your health care provider about prostate cancer screening.  Testicular cancer screening is recommended for adult males. Screening includes self-exam, a health care provider exam, and other screening tests. Consult with your health care provider about any symptoms you have or any concerns you have about testicular cancer.  Use sunscreen. Apply sunscreen liberally and repeatedly throughout the day. You should seek shade when your shadow is shorter than you. Protect yourself by wearing long sleeves, pants, a wide-brimmed hat, and sunglasses year round, whenever you are outdoors.  Once a month, do a whole-body skin exam, using a mirror to look at the skin on your back. Tell your health care provider about new moles, moles that have  irregular borders, moles that are larger than a pencil eraser, or moles that have changed in shape or color.  Stay current with required vaccines (immunizations).  Influenza vaccine. All adults should be immunized every year.  Tetanus, diphtheria, and acellular pertussis (Td, Tdap) vaccine. An adult who has not previously received Tdap or who does not know his vaccine status should receive 1 dose of Tdap. This initial dose should be followed by tetanus and diphtheria toxoids (Td) booster doses every 10 years. Adults with an unknown or incomplete history of completing a 3-dose immunization series with Td-containing vaccines should begin or complete a primary immunization series including a Tdap dose. Adults should receive a Td booster every 10 years.  Zoster vaccine. One dose is recommended for adults aged 26 years or older unless certain conditions are present.    Pneumococcal 13-valent conjugate (PCV13) vaccine. When indicated, a person who is uncertain of his immunization history and has no record of immunization should receive the PCV13 vaccine. An adult aged 52 years or older who has certain medical conditions and has not been previously immunized  should receive 1 dose of PCV13 vaccine. This PCV13 should be followed with a dose of pneumococcal polysaccharide (PPSV23) vaccine. The PPSV23 vaccine dose should be obtained at least 8 weeks after the dose of PCV13 vaccine. An adult aged 104 years or older who has certain medical conditions and previously received 1 or more doses of PPSV23 vaccine should receive 1 dose of PCV13. The PCV13 vaccine dose should be obtained 1 or more years after the last PPSV23 vaccine dose.    Pneumococcal polysaccharide (PPSV23) vaccine. When PCV13 is also indicated, PCV13 should be obtained first. All adults aged 71 years and older should be immunized. An adult younger than age 40 years who has certain medical conditions should be immunized. Any person who resides in a  nursing home or long-term care facility should be immunized. An adult smoker should be immunized. People with an immunocompromised condition and certain other conditions should receive both PCV13 and PPSV23 vaccines. People with human immunodeficiency virus (HIV) infection should be immunized as soon as possible after diagnosis. Immunization during chemotherapy or radiation therapy should be avoided. Routine use of PPSV23 vaccine is not recommended for American Indians, [place] Natives, or people younger than 65 years unless there are medical conditions that require PPSV23 vaccine. When indicated, people who have unknown immunization and have no record of immunization should receive PPSV23 vaccine. One-time revaccination 5 years after the first dose of PPSV23 is recommended for people aged 19-64 years who have chronic kidney failure, nephrotic syndrome, asplenia, or immunocompromised conditions. People who received 1-2 doses of PPSV23 before age 82 years should receive another dose of PPSV23 vaccine at age 75 years or later if at least 5 years have passed since the previous dose. Doses of PPSV23 are not needed for people immunized with PPSV23 at or after age 49 years.  Hepatitis A vaccine. Adults who wish to be protected from this disease, have certain high-risk conditions, work with hepatitis A-infected animals, work in hepatitis A research labs, or travel to or work in countries with a high rate of hepatitis A should be immunized. Adults who were previously unvaccinated and who anticipate close contact with an international adoptee during the first 60 days after arrival in the [place] from a country with a high rate of hepatitis A should be immunized.  Hepatitis B vaccine. Adults should be immunized if they wish to be protected from this disease, have certain high-risk conditions, may be exposed to blood or other infectious body fluids, are household contacts or sex partners of hepatitis B positive  people, are clients or workers in certain care facilities, or travel to or work in countries with a high rate of hepatitis B.  Preventive Service / Frequency  Ages 59 to 45  Blood pressure check.  Lipid and cholesterol check.  Hepatitis C blood test.** / For any individual with known risks for hepatitis C.  Skin self-exam. / Monthly.  Influenza vaccine. / Every year.  Tetanus, diphtheria, and acellular pertussis (Tdap, Td) vaccine.** / Consult your health care provider. 1 dose of Td every 10 years.  HPV vaccine. / 3 doses over 6 months, if 51 or younger.  Measles, mumps, rubella (MMR) vaccine.** / You need at least 1 dose of MMR if you were born in 1957 or later. You may also need a second dose.  Pneumococcal 13-valent conjugate (PCV13) vaccine.** / Consult your health care provider.  Pneumococcal polysaccharide (PPSV23) vaccine.** / 1 to 2 doses if you smoke cigarettes or if you have  certain conditions.  Meningococcal vaccine.** / 1 dose if you are age 19 to 21 years and a first-year college student living in a residence hall, or have one of several medical conditions. You may also need additional booster doses.  Hepatitis A vaccine.** / Consult your health care provider.  Hepatitis B vaccine.** / Consult your health care provider. +++++++++ Recommend Adult Low Dose Aspirin or  coated  Aspirin 81 mg daily  To reduce risk of Colon Cancer 20 %,  Skin Cancer 26 % ,  Melanoma 46%  and  Pancreatic cancer 60% ++++++++++++++++++ Vitamin D goal  is between 70-100.  Please make sure that you are taking your Vitamin D as directed.  It is very important as a natural anti-inflammatory  helping hair, skin, and nails, as well as reducing stroke and heart attack risk.  It helps your bones and helps with mood. It also decreases numerous cancer risks so please take it as directed.  Low Vit D is associated with a 200-300% higher risk for CANCER  and 200-300% higher risk for HEART    ATTACK  &  STROKE.   ...................................... It is also associated with higher death rate at younger ages,  autoimmune diseases like Rheumatoid arthritis, Lupus, Multiple Sclerosis.    Also many other serious conditions, like depression, Alzheimer's Dementia, infertility, muscle aches, fatigue, fibromyalgia - just to name a few. +++++++++++++++++++++ Recommend the book "The END of DIETING" by Dr Joel Fuhrman  & the book "The END of DIABETES " by Dr Joel Fuhrman At [employer] - get book & Audio CD's    Being diabetic has a  300% increased risk for heart attack, stroke, cancer, and alzheimer- type vascular dementia. It is very important that you work harder with diet by avoiding all foods that are white. Avoid white rice (brown & wild rice is OK), white potatoes (sweetpotatoes in moderation is OK), White bread or wheat bread or anything made out of white flour like bagels, donuts, rolls, buns, biscuits, cakes, pastries, cookies, pizza crust, and pasta (made from white flour & egg whites) - vegetarian pasta or spinach or wheat pasta is OK. Multigrain breads like Arnold's or [employer], or multigrain sandwich thins or flatbreads.  Diet, exercise and weight loss can reverse and cure diabetes in the early stages.  Diet, exercise and weight loss is very important in the control and prevention of complications of diabetes which affects every system in your body, ie. Brain - dementia/stroke, eyes - glaucoma/blindness, heart - heart attack/heart failure, kidneys - dialysis, stomach - gastric paralysis, intestines - malabsorption, nerves - severe painful neuritis, circulation - gangrene & loss of a leg(s), and finally cancer and Alzheimers.    I recommend avoid fried & greasy foods,  sweets/candy, white rice (brown or wild rice or Quinoa is OK), white potatoes (sweet potatoes are OK) - anything made from white flour - bagels, doughnuts, rolls, buns, biscuits,white and wheat breads, pizza crust  and traditional pasta made of white flour & egg white(vegetarian pasta or spinach or wheat pasta is OK).  Multi-grain bread is OK - like multi-grain flat bread or sandwich thins. Avoid alcohol in excess. Exercise is also important.    Eat all the vegetables you want - avoid meat, especially red meat and dairy - especially cheese.  Cheese is the most concentrated form of trans-fats which is the worst thing to clog up our arteries. Veggie cheese is OK which can be found in the fresh produce section at [employer] or   [employer] or [employer]  +++++++++++++++++++ DASH Eating Plan  DASH stands for "Dietary Approaches to Stop Hypertension."   The DASH eating plan is a healthy eating plan that has been shown to reduce high blood pressure (hypertension). Additional health benefits may include reducing the risk of type 2 diabetes mellitus, heart disease, and stroke. The DASH eating plan may also help with weight loss. WHAT DO I NEED TO KNOW ABOUT THE DASH EATING PLAN? For the DASH eating plan, you will follow these general guidelines:  Choose foods with a percent daily value for sodium of less than 5% (as listed on the food label).  Use salt-free seasonings or herbs instead of table salt or sea salt.  Check with your health care provider or pharmacist before using salt substitutes.  Eat lower-sodium products, often labeled as "lower sodium" or "no salt added."  Eat fresh foods.  Eat more vegetables, fruits, and low-fat dairy products.  Choose whole grains. Look for the word "whole" as the first word in the ingredient list.  Choose fish   Limit sweets, desserts, sugars, and sugary drinks.  Choose heart-healthy fats.  Eat veggie cheese   Eat more home-cooked food and less restaurant, buffet, and fast food.  Limit fried foods.  Cook foods using methods other than frying.  Limit canned vegetables. If you do use them, rinse them well to decrease the sodium.  When eating at a  restaurant, ask that your food be prepared with less salt, or no salt if possible.                      WHAT FOODS CAN I EAT? Read Dr Joel Fuhrman's books on The End of Dieting & The End of Diabetes  Grains Whole grain or whole wheat bread. Brown rice. Whole grain or whole wheat pasta. Quinoa, bulgur, and whole grain cereals. Low-sodium cereals. Corn or whole wheat flour tortillas. Whole grain cornbread. Whole grain crackers. Low-sodium crackers.  Vegetables Fresh or frozen vegetables (raw, steamed, roasted, or grilled). Low-sodium or reduced-sodium tomato and vegetable juices. Low-sodium or reduced-sodium tomato sauce and paste. Low-sodium or reduced-sodium canned vegetables.   Fruits All fresh, canned (in natural juice), or frozen fruits.  Protein Products  All fish and seafood.  Dried beans, peas, or lentils. Unsalted nuts and seeds. Unsalted canned beans.  Dairy Low-fat dairy products, such as skim or 1% milk, 2% or reduced-fat cheeses, low-fat ricotta or cottage cheese, or plain low-fat yogurt. Low-sodium or reduced-sodium cheeses.  Fats and Oils Tub margarines without trans fats. Light or reduced-fat mayonnaise and salad dressings (reduced sodium). Avocado. Safflower, olive, or canola oils. Natural peanut or almond butter.  Other Unsalted popcorn and pretzels. The items listed above may not be a complete list of recommended foods or beverages. Contact your dietitian for more options.  +++++++++++++++++++  WHAT FOODS ARE NOT RECOMMENDED? Grains/ White flour or wheat flour White bread. White pasta. White rice. Refined cornbread. Bagels and croissants. Crackers that contain trans fat.  Vegetables  Creamed or fried vegetables. Vegetables in a . Regular canned vegetables. Regular canned tomato sauce and paste. Regular tomato and vegetable juices.  Fruits Dried fruits. Canned fruit in light or heavy syrup. Fruit juice.  Meat and Other Protein Products Meat in general - RED  meat & White meat.  Fatty cuts of meat. Ribs, chicken wings, all processed meats as bacon, sausage, bologna, salami, fatback, hot dogs, bratwurst and packaged luncheon meats.  Dairy Whole or 2% milk, cream,   half-and-half, and cream cheese. Whole-fat or sweetened yogurt. Full-fat cheeses or blue cheese. Non-dairy creamers and whipped toppings. Processed cheese, cheese spreads, or cheese curds.  Condiments Onion and garlic salt, seasoned salt, table salt, and sea salt. Canned and packaged gravies. Worcestershire sauce. Tartar sauce. Barbecue sauce. Teriyaki sauce. Soy sauce, including reduced sodium. Steak sauce. Fish sauce. Oyster sauce. Cocktail sauce. Horseradish. Ketchup and mustard. Meat flavorings and tenderizers. Bouillon cubes. Hot sauce. Tabasco sauce. Marinades. Taco seasonings. Relishes.  Fats and Oils Butter, stick margarine, lard, shortening and bacon fat. Coconut, palm kernel, or palm oils. Regular salad dressings.  Pickles and olives. Salted popcorn and pretzels.  The items listed above may not be a complete list of foods and beverages to avoid.    

## 2016-03-20 NOTE — Progress Notes (Signed)
[facility]   Lucky Cowboy, M.D.    Dyanne Carrel. Steffanie Dunn, P.A.-C      Terri Piedra, P.A.-C [facility]                [address]                [place], [place]. [address] Telephone [phone] Telefax [phone]  Annual  Screening/Preventative Visit  And Comprehensive Evaluation & Examination     This very nice 20y.o.SBM presents for a Wellness/Preventative Visit & comprehensive evaluation and management of multiple medical co-morbidities.  Patient has been followed for screening for elevated BP, Prediabetes and Vitamin D Deficiency and he does have severe  Hyperlipidemia     Patient is proactively screened for elevated BP and patient's BP has been controlled at home. Today's BP is 128/76. Patient denies any cardiac symptoms as chest pain, palpitations, shortness of breath, dizziness or ankle swelling.     Patient  Has hx/o severe hyperlipidemia  With LDL's ranging 200-210 and has been Rx'd Crestor which he has not taken.  Patient has had repeated inservices in the past on the importance of better Cholesterol control and on one occasion his mother was present! Last lipids were not at goal: Lab Results  Component Value Date   CHOL 311 (H) 03/20/2016   HDL 38 (L) 03/20/2016   LDLCALC NOT CALC 03/20/2016   TRIG 617 (H) 03/20/2016   CHOLHDL 8.2 (H) 03/20/2016      Patient is proactively screened for  prediabetes and patient denies reactive hypoglycemic symptoms, visual blurring, diabetic polys or paresthesias. Last A1c was WNL: Lab Results  Component Value Date   HGBA1C 5.1 03/20/2016       Finally, patient has history of Vitamin D Deficiency of "7" in 2010 and he is not compliant in supplementing as recommended & last vitamin D was still extremely low: Lab Results  Component Value Date   VD25OH 10 (L) 03/20/2016   Current Outpatient Prescriptions on File Prior to Visit  Medication Sig  . VITAMIN D 5,000 u  Taking 2 daily sporadically   No Known Allergies   Past Medical History:  Diagnosis Date  . Hyperlipidemia   . Vitamin D deficiency    Health Maintenance  Topic Date Due  . HIV Screening  12/14/2008  . TETANUS/TDAP  12/14/2012  . INFLUENZA VACCINE  02/18/2016   Immunization History  Administered Date(s) Administered  . HPV Quadrivalent 07/09/2011  . PPD Test 02/28/2015, 03/20/2016  . Tdap 02/21/2009   Past Surgical History:  Procedure Laterality Date  . ACHILLES TENDON SURGERY Bilateral 2007   Dr Cheryll Cockayne   Family History  Problem Relation Age of Onset  . Hypertension Mother   . Diabetes Father    Social History   Social History  . Marital status: Single    Spouse name: N/A  . Number of children: N/A  . Years of education: N/A   Occupational History  . Student College    Social History Main Topics  . Smoking status: Never Smoker  . Smokeless tobacco: Never Used  . Alcohol use 0.0 oz/week     Comment: occasional  . Drug use: No  . Sexual activity: Active    ROS Constitutional: Denies fever, chills, weight loss/gain, headaches, insomnia,  night sweats or change in appetite. Does c/o fatigue. Eyes: Denies redness, blurred vision, diplopia, discharge, itchy or watery eyes.  ENT: Denies discharge, congestion, post nasal drip, epistaxis,  sore throat, earache, hearing loss, dental pain, Tinnitus, Vertigo, Sinus pain or snoring.  Cardio: Denies chest pain, palpitations, irregular heartbeat, syncope, dyspnea, diaphoresis, orthopnea, PND, claudication or edema Respiratory: denies cough, dyspnea, DOE, pleurisy, hoarseness, laryngitis or wheezing.  Gastrointestinal: Denies dysphagia, heartburn, reflux, water brash, pain, cramps, nausea, vomiting, bloating, diarrhea, constipation, hematemesis, melena, hematochezia, jaundice or hemorrhoids Genitourinary: Denies dysuria, frequency, urgency, nocturia, hesitancy, discharge, hematuria or flank pain Musculoskeletal: Denies  arthralgia, myalgia, stiffness, Jt. Swelling, pain, limp or strain/sprain. Denies Falls. Skin: Denies puritis, rash, hives, warts, acne, eczema or change in skin lesion Neuro: No weakness, tremor, incoordination, spasms, paresthesia or pain Psychiatric: Denies confusion, memory loss or sensory loss. Denies Depression. Endocrine: Denies change in weight, skin, hair change, nocturia, and paresthesia, diabetic polys, visual blurring or hyper / hypo glycemic episodes.  Heme/Lymph: No excessive bleeding, bruising or enlarged lymph nodes.  Physical Exam  BP 128/76   Pulse 76   Temp 97.8 F (36.6 C)   Resp 16   Ht 5' 8.25" (1.734 m)   Wt 189 lb 3.2 oz (85.8 kg)   BMI 28.56 kg/m   General Appearance: Well nourished, in no apparent distress.  Eyes: PERRLA, EOMs, conjunctiva no swelling or erythema, normal fundi and vessels. Sinuses: No frontal/maxillary tenderness ENT/Mouth: EACs patent / TMs  nl. Nares clear without erythema, swelling, mucoid exudates. Oral hygiene is good. No erythema, swelling, or exudate. Tongue normal, non-obstructing. Tonsils not swollen or erythematous. Hearing normal.  Neck: Supple, thyroid normal. No bruits, nodes or JVD. Respiratory: Respiratory effort normal.  BS equal and clear bilateral without rales, rhonci, wheezing or stridor. Cardio: Heart sounds are normal with regular rate and rhythm and no murmurs, rubs or gallops. Peripheral pulses are normal and equal bilaterally without edema. No aortic or femoral bruits. Chest: symmetric with normal excursions and percussion.  Abdomen: Soft, with Nl bowel sounds. Nontender, no guarding, rebound, hernias, masses, or organomegaly.  Lymphatics: Non tender without lymphadenopathy.  Genitourinary: No hernias.Testes nl. DRE - prostate nl for age - smooth & firm w/o nodules. Musculoskeletal: Full ROM all peripheral extremities, joint stability, 5/5 strength, and normal gait. Skin: Warm and dry without rashes, lesions,  cyanosis, clubbing or  ecchymosis.  Neuro: Cranial nerves intact, reflexes equal bilaterally. Normal muscle tone, no cerebellar symptoms. Sensation intact.  Pysch: Alert and oriented X 3 with normal affect, insight and judgment appropriate.   Assessment and Plan  1. Encounter for general adult medical examination with abnormal findings  - Microalbumin / creatinine urine ratio - EKG 12-Lead - POC Hemoccult Bld/Stl  - Urinalysis, Routine w reflex microscopic - Vitamin B12 - Iron and TIBC - CBC with Differential/Platelet - BASIC METABOLIC PANEL WITH GFR - Hepatic function panel - Magnesium - Lipid panel - TSH - Hemoglobin A1c - Insulin, random - VITAMIN D 25 Hydroxy - PPD  2. Elevated BP  - Microalbumin / creatinine urine ratio - EKG 12-Lead - TSH  3. Hyperlipidemia  - Very long discussion again today on the importance of better Cholesterol control and patient did acknowledge.  - Lipid panel - TSH  4. Other abnormal glucose  - Hemoglobin A1c - Insulin, random  5. Vitamin D deficiency  - VITAMIN D 25 Hydroxy   6. Screening for rectal cancer  - POC Hemoccult Bld/Stl   7. Other fatigue  - Vitamin B12 - Iron and TIBC - CBC with Differential/Platelet  8. Medication management  - Urinalysis, Routine w reflex microscopic  - CBC with Differential/Platelet - BASIC METABOLIC  PANEL WITH GFR - Hepatic function panel - Magnesium  9. Screening for ischemic heart disease   10. Screening examination for pulmonary tuberculosis  - PPD     Continue prudent diet as discussed, weight control, BP monitoring, regular exercise, and medications as discussed.  Discussed med effects and SE's. Routine screening labs and tests as requested with regular follow-up as recommended. Over 40 minutes of exam, counseling, chart review and high complex critical decision making was performed

## 2016-03-21 LAB — URINALYSIS, ROUTINE W REFLEX MICROSCOPIC

## 2016-03-21 LAB — HEMOGLOBIN A1C
HEMOGLOBIN A1C: 5.1 % (ref <5.7)
Mean Plasma Glucose: 100 mg/dL

## 2016-03-21 LAB — MICROALBUMIN / CREATININE URINE RATIO

## 2016-03-21 LAB — VITAMIN B12: VITAMIN B 12: 422 pg/mL (ref 200–1100)

## 2016-03-21 LAB — INSULIN, RANDOM: INSULIN: 76.4 u[IU]/mL — AB (ref 2.0–19.6)

## 2016-03-21 LAB — VITAMIN D 25 HYDROXY (VIT D DEFICIENCY, FRACTURES): VIT D 25 HYDROXY: 10 ng/mL — AB (ref 30–100)

## 2016-03-23 ENCOUNTER — Encounter: Payer: Self-pay | Admitting: Internal Medicine

## 2016-06-29 ENCOUNTER — Ambulatory Visit: Payer: Self-pay | Admitting: Internal Medicine

## 2016-10-05 ENCOUNTER — Ambulatory Visit: Payer: Self-pay | Admitting: Internal Medicine

## 2016-12-10 ENCOUNTER — Ambulatory Visit: Payer: Self-pay | Admitting: Internal Medicine

## 2017-01-11 ENCOUNTER — Ambulatory Visit: Payer: Self-pay | Admitting: Internal Medicine

## 2017-04-20 ENCOUNTER — Encounter: Payer: Self-pay | Admitting: Internal Medicine

## 2017-06-16 ENCOUNTER — Encounter: Payer: Self-pay | Admitting: Internal Medicine

## 2018-05-24 ENCOUNTER — Encounter: Payer: Self-pay | Admitting: Internal Medicine

## 2018-07-25 ENCOUNTER — Encounter: Payer: Self-pay | Admitting: Internal Medicine

## 2020-11-23 ENCOUNTER — Other Ambulatory Visit: Payer: Self-pay

## 2020-11-23 ENCOUNTER — Encounter (HOSPITAL_COMMUNITY): Payer: Self-pay

## 2020-11-23 ENCOUNTER — Ambulatory Visit (HOSPITAL_COMMUNITY)
Admission: EM | Admit: 2020-11-23 | Discharge: 2020-11-23 | Disposition: A | Discharge status: Home / Self Care | Payer: Self-pay | Attending: Emergency Medicine | Admitting: Emergency Medicine

## 2020-11-23 DIAGNOSIS — R519 Headache, unspecified: Secondary | ICD-10-CM | POA: Insufficient documentation

## 2020-11-23 DIAGNOSIS — R200 Anesthesia of skin: Secondary | ICD-10-CM | POA: Insufficient documentation

## 2020-11-23 LAB — CBC
HCT: 44.5 % (ref 39.0–52.0)
Hemoglobin: 14.7 g/dL (ref 13.0–17.0)
MCH: 26.6 pg (ref 26.0–34.0)
MCHC: 33 g/dL (ref 30.0–36.0)
MCV: 80.6 fL (ref 80.0–100.0)
Platelets: 398 10*3/uL (ref 150–400)
RBC: 5.52 MIL/uL (ref 4.22–5.81)
RDW: 13.5 % (ref 11.5–15.5)
WBC: 5.6 10*3/uL (ref 4.0–10.5)
nRBC: 0 % (ref 0.0–0.2)

## 2020-11-23 LAB — BASIC METABOLIC PANEL
Anion gap: 8 (ref 5–15)
BUN: 10 mg/dL (ref 6–20)
CO2: 26 mmol/L (ref 22–32)
Calcium: 9.5 mg/dL (ref 8.9–10.3)
Chloride: 101 mmol/L (ref 98–111)
Creatinine, Ser: 0.93 mg/dL (ref 0.61–1.24)
GFR, Estimated: >60 mL/min (ref >60)
Glucose, Bld: 99 mg/dL (ref 70–99)
Potassium: 4 mmol/L (ref 3.5–5.1)
Sodium: 135 mmol/L (ref 135–145)

## 2020-11-23 LAB — HEMOGLOBIN A1C
Hgb A1c MFr Bld: 5.5 % (ref 4.8–5.6)
Mean Plasma Glucose: 111.15 mg/dL

## 2020-11-23 NOTE — ED Provider Notes (Signed)
Willie Archer   [id number] 11/23/20 Arrival Time: 1657   Chief Complaint  Patient presents with  . Headache  . Numbness     SUBJECTIVE: History from: patient.  Willie Archer is a 25 y.o. male presented to the urgent care for complaint of body numbness for the past 1 day and headache for the past 2 days.  Denies any precipitating event.  States  his father and mother have diabetes and is not sure if symptom is related.  Denies recent travel.  Has tried OTC medication without relief.  Denies alleviating or aggravating factors.  Denies previous symptoms in the past.   Denies fever, chills, fatigue, sinus pain, rhinorrhea, sore throat, SOB, wheezing, chest pain, nausea, blurry vision, diplopia, facial droop, changes in bowel or bladder habits.     ROS: As per HPI.  All other pertinent ROS negative.     Past Medical History:  Diagnosis Date  . Hyperlipidemia   . Vitamin D deficiency    Past Surgical History:  Procedure Laterality Date  . ACHILLES TENDON SURGERY Bilateral 2007   Dr Cheryll Cockayne   No Known Allergies No current facility-administered medications on file prior to encounter.   Current Outpatient Medications on File Prior to Encounter  Medication Sig Dispense Refill  . Cholecalciferol (VITAMIN D PO) Take 5,000 Int'l Units by mouth daily. Taking 2 daily    . rosuvastatin (CRESTOR) 40 MG tablet Take1 tablet daily for Cholesterol 90 tablet 1   Social History   Socioeconomic History  . Marital status: Single    Spouse name: Not on file  . Number of children: Not on file  . Years of education: Not on file  . Highest education level: Not on file  Occupational History  . Not on file  Tobacco Use  . Smoking status: Never Smoker  . Smokeless tobacco: Never Used  Substance and Sexual Activity  . Alcohol use: Yes    Alcohol/week: 0.0 standard drinks    Comment: occasional  . Drug use: No  . Sexual activity: Not on file  Other Topics Concern  . Not on file   Social History Narrative  . Not on file   Social Determinants of Health   Financial Resource Strain: Not on file  Food Insecurity: Not on file  Transportation Needs: Not on file  Physical Activity: Not on file  Stress: Not on file  Social Connections: Not on file  Intimate Partner Violence: Not on file   Family History  Problem Relation Age of Onset  . Hypertension Mother   . Diabetes Father     OBJECTIVE:  Vitals:   11/23/20 1740  BP: 131/78  Pulse: 100  Resp: 17  Temp: 98.2 F (36.8 C)  TempSrc: Oral  SpO2: 98%     Physical Exam Vitals and nursing note reviewed.  Constitutional:      General: He is not in acute distress.    Appearance: Normal appearance. He is normal weight. He is not ill-appearing, toxic-appearing or diaphoretic.  HENT:     Head: Normocephalic.     Right Ear: Tympanic membrane, ear canal and external ear normal. There is no impacted cerumen.     Left Ear: Tympanic membrane, ear canal and external ear normal. There is no impacted cerumen.  Cardiovascular:     Rate and Rhythm: Normal rate and regular rhythm.     Pulses: Normal pulses.     Heart sounds: Normal heart sounds. No murmur heard. No friction rub. No gallop.  Pulmonary:     Effort: Pulmonary effort is normal. No respiratory distress.     Breath sounds: Normal breath sounds. No stridor. No wheezing, rhonchi or rales.  Chest:     Chest wall: No tenderness.  Neurological:     General: No focal deficit present.     Mental Status: He is alert and oriented to person, place, and time.     GCS: GCS eye subscore is 4. GCS verbal subscore is 5. GCS motor subscore is 6.     Cranial Nerves: Cranial nerves are intact.     Sensory: Sensation is intact.     Motor: Motor function is intact.     Coordination: Coordination is intact.     Gait: Gait is intact.      LABS:  No results found for this or any previous visit (from the past 24 hour(s)).   ASSESSMENT & PLAN:  1. Numbness   2.  Acute intractable headache, unspecified headache type     No orders of the defined types were placed in this encounter.  Patient is stable at discharge.  Neuro exam is otherwise normal.  CBC, CMP and A1c were completed.  Discharge instructions  CBC, CMP and A1c were ordered today visit He was advised to follow-up and establish care with PCP PCP resource was provided Take OTC Tylenol/ibuprofen as needed for headache Return or go to ED if you develop any new or worsening of symptoms  Reviewed expectations re: course of current medical issues. Questions answered. Outlined signs and symptoms indicating need for more acute intervention. Patient verbalized understanding. After Visit Summary given.         Durward Parcel, FNP 11/23/20 1820

## 2020-11-23 NOTE — ED Triage Notes (Signed)
Pt c/o numbness all over the body X  1 day. Pt states he has a headache x 2 days.

## 2020-11-23 NOTE — Discharge Instructions (Signed)
  CBC, CMP and A1c were ordered today visit He was advised to follow-up and establish care with PCP PCP resource was provided Take OTC Tylenol/ibuprofen as needed for headache Return or go to ED if you develop any new or worsening of symptoms

## 2021-05-12 ENCOUNTER — Other Ambulatory Visit (HOSPITAL_COMMUNITY): Payer: Self-pay

## 2021-05-12 MED ORDER — INFLUENZA VAC SPLIT QUAD 0.5 ML IM SUSY
0.5000 mL | PREFILLED_SYRINGE | INTRAMUSCULAR | 0 refills | Status: DC
  Filled 2021-05-12: qty 0.5, 1d supply, fill #0

## 2021-05-30 DIAGNOSIS — H6123 Impacted cerumen, bilateral: Secondary | ICD-10-CM | POA: Diagnosis not present

## 2021-05-30 DIAGNOSIS — H9193 Unspecified hearing loss, bilateral: Secondary | ICD-10-CM | POA: Diagnosis not present

## 2021-06-06 DIAGNOSIS — R030 Elevated blood-pressure reading, without diagnosis of hypertension: Secondary | ICD-10-CM | POA: Diagnosis not present

## 2021-06-06 DIAGNOSIS — J40 Bronchitis, not specified as acute or chronic: Secondary | ICD-10-CM | POA: Diagnosis not present

## 2021-07-04 ENCOUNTER — Other Ambulatory Visit: Payer: Self-pay

## 2021-07-04 ENCOUNTER — Inpatient Hospital Stay (HOSPITAL_BASED_OUTPATIENT_CLINIC_OR_DEPARTMENT_OTHER)
Admission: EM | Admit: 2021-07-04 | Discharge: 2021-07-09 | DRG: 059 | Disposition: A | Discharge status: Home / Self Care | Payer: 59 | Source: Ambulatory Visit | Attending: Student | Admitting: Student

## 2021-07-04 ENCOUNTER — Encounter (HOSPITAL_BASED_OUTPATIENT_CLINIC_OR_DEPARTMENT_OTHER): Payer: Self-pay | Admitting: *Deleted

## 2021-07-04 DIAGNOSIS — Z91199 Patient's noncompliance with other medical treatment and regimen due to unspecified reason: Secondary | ICD-10-CM

## 2021-07-04 DIAGNOSIS — E669 Obesity, unspecified: Secondary | ICD-10-CM | POA: Diagnosis not present

## 2021-07-04 DIAGNOSIS — Z833 Family history of diabetes mellitus: Secondary | ICD-10-CM | POA: Diagnosis not present

## 2021-07-04 DIAGNOSIS — R29818 Other symptoms and signs involving the nervous system: Secondary | ICD-10-CM | POA: Diagnosis not present

## 2021-07-04 DIAGNOSIS — Z20822 Contact with and (suspected) exposure to covid-19: Secondary | ICD-10-CM | POA: Diagnosis present

## 2021-07-04 DIAGNOSIS — I10 Essential (primary) hypertension: Secondary | ICD-10-CM | POA: Diagnosis present

## 2021-07-04 DIAGNOSIS — Z8249 Family history of ischemic heart disease and other diseases of the circulatory system: Secondary | ICD-10-CM

## 2021-07-04 DIAGNOSIS — Z6832 Body mass index (BMI) 32.0-32.9, adult: Secondary | ICD-10-CM

## 2021-07-04 DIAGNOSIS — E871 Hypo-osmolality and hyponatremia: Secondary | ICD-10-CM | POA: Diagnosis not present

## 2021-07-04 DIAGNOSIS — E559 Vitamin D deficiency, unspecified: Secondary | ICD-10-CM | POA: Diagnosis present

## 2021-07-04 DIAGNOSIS — G9589 Other specified diseases of spinal cord: Secondary | ICD-10-CM | POA: Diagnosis not present

## 2021-07-04 DIAGNOSIS — M5126 Other intervertebral disc displacement, lumbar region: Secondary | ICD-10-CM | POA: Diagnosis not present

## 2021-07-04 DIAGNOSIS — R269 Unspecified abnormalities of gait and mobility: Secondary | ICD-10-CM | POA: Diagnosis not present

## 2021-07-04 DIAGNOSIS — G35 Multiple sclerosis: Secondary | ICD-10-CM | POA: Diagnosis not present

## 2021-07-04 DIAGNOSIS — E876 Hypokalemia: Secondary | ICD-10-CM | POA: Diagnosis not present

## 2021-07-04 DIAGNOSIS — E785 Hyperlipidemia, unspecified: Secondary | ICD-10-CM | POA: Diagnosis not present

## 2021-07-04 DIAGNOSIS — G610 Guillain-Barre syndrome: Secondary | ICD-10-CM | POA: Diagnosis not present

## 2021-07-04 DIAGNOSIS — E119 Type 2 diabetes mellitus without complications: Secondary | ICD-10-CM | POA: Diagnosis present

## 2021-07-04 DIAGNOSIS — G8310 Monoplegia of lower limb affecting unspecified side: Secondary | ICD-10-CM | POA: Diagnosis not present

## 2021-07-04 DIAGNOSIS — M5127 Other intervertebral disc displacement, lumbosacral region: Secondary | ICD-10-CM | POA: Diagnosis not present

## 2021-07-04 DIAGNOSIS — G379 Demyelinating disease of central nervous system, unspecified: Secondary | ICD-10-CM | POA: Diagnosis not present

## 2021-07-04 DIAGNOSIS — G959 Disease of spinal cord, unspecified: Secondary | ICD-10-CM | POA: Diagnosis not present

## 2021-07-04 LAB — CBC WITH DIFFERENTIAL/PLATELET
Abs Immature Granulocytes: 0.01 10*3/uL (ref 0.00–0.07)
Basophils Absolute: 0 10*3/uL (ref 0.0–0.1)
Basophils Relative: 1 %
Eosinophils Absolute: 0.1 10*3/uL (ref 0.0–0.5)
Eosinophils Relative: 2 %
HCT: 43.4 % (ref 39.0–52.0)
Hemoglobin: 14.6 g/dL (ref 13.0–17.0)
Immature Granulocytes: 0 %
Lymphocytes Relative: 32 %
Lymphs Abs: 2.1 10*3/uL (ref 0.7–4.0)
MCH: 26.6 pg (ref 26.0–34.0)
MCHC: 33.6 g/dL (ref 30.0–36.0)
MCV: 79.2 fL — ABNORMAL LOW (ref 80.0–100.0)
Monocytes Absolute: 0.5 10*3/uL (ref 0.1–1.0)
Monocytes Relative: 8 %
Neutro Abs: 3.8 10*3/uL (ref 1.7–7.7)
Neutrophils Relative %: 57 %
Platelets: 300 10*3/uL (ref 150–400)
RBC: 5.48 MIL/uL (ref 4.22–5.81)
RDW: 13.6 % (ref 11.5–15.5)
WBC: 6.6 10*3/uL (ref 4.0–10.5)
nRBC: 0 % (ref 0.0–0.2)

## 2021-07-04 LAB — SEDIMENTATION RATE: Sed Rate: 20 mm/hr — ABNORMAL HIGH (ref 0–16)

## 2021-07-04 LAB — COMPREHENSIVE METABOLIC PANEL
ALT: 30 U/L (ref 0–44)
AST: 16 U/L (ref 15–41)
Albumin: 4.7 g/dL (ref 3.5–5.0)
Alkaline Phosphatase: 61 U/L (ref 38–126)
Anion gap: 10 (ref 5–15)
BUN: 19 mg/dL (ref 6–20)
CO2: 27 mmol/L (ref 22–32)
Calcium: 9.7 mg/dL (ref 8.9–10.3)
Chloride: 101 mmol/L (ref 98–111)
Creatinine, Ser: 0.99 mg/dL (ref 0.61–1.24)
GFR, Estimated: >60 mL/min (ref >60)
Glucose, Bld: 99 mg/dL (ref 70–99)
Potassium: 3.4 mmol/L — ABNORMAL LOW (ref 3.5–5.1)
Sodium: 138 mmol/L (ref 135–145)
Total Bilirubin: 0.5 mg/dL (ref 0.3–1.2)
Total Protein: 7.8 g/dL (ref 6.5–8.1)

## 2021-07-04 LAB — RESP PANEL BY RT-PCR (FLU A&B, COVID) ARPGX2
Influenza A by PCR: NEGATIVE
Influenza B by PCR: NEGATIVE
SARS Coronavirus 2 by RT PCR: NEGATIVE

## 2021-07-04 MED ORDER — IMMUNE GLOBULIN (HUMAN) 10 GM/100ML IV SOLN
400.0000 mg/kg | INTRAVENOUS | Status: DC
  Administered 2021-07-04: 23:00:00 40 g via INTRAVENOUS
  Filled 2021-07-04 (×3): qty 400

## 2021-07-04 NOTE — ED Triage Notes (Signed)
Pt reports on and off numbness (both feet) since Sunday. Feels like he cannot walk well, because of his symptoms. Sent by [facility] for further eval

## 2021-07-04 NOTE — ED Provider Notes (Signed)
[facility] EMERGENCY DEPT Provider Note   CSN: [id number] Arrival date & time: 07/04/21  1902     History Chief Complaint  Patient presents with   Numbness    DAVIONE LENKER is a 25 y.o. male.  Pt presents to the ED today with numbness in both feet.  Pt feels like the right side is worse than the left.  Pt said he shuffles when he walks because he can't feel the ground.  Pt said he did have a cough about 2 weeks ago and took a flu/covid test which was negative.  Pt [facility] went to [facility] who sent him here.      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   Encounter for general adult medical examination with abnormal findings 03/20/2016   Elevated BP 03/20/2016   Other abnormal glucose 03/20/2016   Medication management 03/20/2016   Hyperlipidemia    Vitamin D deficiency     Past Surgical History:  Procedure Laterality Date   ACHILLES TENDON SURGERY Bilateral 2007   Dr Cheryll Cockayne       Family History  Problem Relation Age of Onset   Hypertension Mother    Diabetes Father     Social History   Tobacco Use   Smoking status: Never   Smokeless tobacco: Never  Substance Use Topics   Alcohol use: Yes    Alcohol/week: 0.0 standard drinks    Comment: occasional   Drug use: No    Home Medications Prior to Admission medications   Medication Sig Start Date End Date Taking? Authorizing Provider  Cholecalciferol (VITAMIN D PO) Take 5,000 Int'l Units by mouth daily. Taking 2 daily    [provider]  influenza vac split quadrivalent PF (FLUARIX) 0.5 ML injection Inject 0.5 mLs into the muscle. 05/12/21   Judyann Munson, MD  rosuvastatin (CRESTOR) 40 MG tablet Take1 tablet daily for Cholesterol 03/20/16 09/18/16  Lucky Cowboy, MD    Allergies    Patient has no known allergies.  Review of Systems   Review of  Systems  Neurological:  Positive for numbness.  All other systems reviewed and are negative.  Physical Exam Updated Vital Signs BP 132/88 (BP Location: Right Arm)    Pulse 88    Temp 98.1 F (36.7 C)    Resp 16    Ht 5\' 8"  (1.727 m)    Wt 98.4 kg    SpO2 100%    BMI 32.99 kg/m   Physical Exam Vitals and nursing note reviewed.  Constitutional:      Appearance: Normal appearance. He is obese.  HENT:     Head: Normocephalic and atraumatic.     Right Ear: External ear normal.     Left Ear: External ear normal.     Nose: Nose normal.     Mouth/Throat:     Mouth: Mucous membranes are moist.     Pharynx: Oropharynx is clear.  Eyes:     Extraocular Movements: Extraocular movements intact.     Conjunctiva/sclera: Conjunctivae normal.     Pupils: Pupils are equal, round, and reactive to light.  Cardiovascular:     Rate and Rhythm: Normal rate and regular rhythm.     Pulses: Normal pulses.     Heart sounds: Normal heart sounds.  Pulmonary:     Effort: Pulmonary effort is normal.  Breath sounds: Normal breath sounds.  Abdominal:     General: Abdomen is flat. Bowel sounds are normal.     Palpations: Abdomen is soft.  Musculoskeletal:        General: Normal range of motion.     Cervical back: Normal range of motion and neck supple.  Skin:    General: Skin is warm.     Capillary Refill: Capillary refill takes less than 2 seconds.  Neurological:     Mental Status: He is alert and oriented to person, place, and time.     Comments: Pt can feel me touching his feet, but it feels "weird."  Decreased reflexes.  Psychiatric:        Mood and Affect: Mood normal.        Behavior: Behavior normal.    ED Results / Procedures / Treatments   Labs (all labs ordered are listed, but only abnormal results are displayed) Labs Reviewed  COMPREHENSIVE METABOLIC PANEL - Abnormal; Notable for the following components:      Result Value   Potassium 3.4 (*)    All other components within normal  limits  CBC WITH DIFFERENTIAL/PLATELET - Abnormal; Notable for the following components:   MCV 79.2 (*)    All other components within normal limits  SEDIMENTATION RATE - Abnormal; Notable for the following components:   Sed Rate 20 (*)    All other components within normal limits  RESP PANEL BY RT-PCR (FLU A&B, COVID) ARPGX2  C-REACTIVE PROTEIN  HEMOGLOBIN A1C    EKG None  Radiology No results found.  Procedures Procedures   Medications Ordered in ED Medications  Immune Globulin 10% (PRIVIGEN) IV infusion 40 g (has no administration in time range)    ED Course  I have reviewed the triage vital signs and the nursing notes.  Pertinent labs & imaging results that were available during my care of the patient were reviewed by me and considered in my medical decision making (see chart for details).    MDM Rules/Calculators/A&P                         Pt d/w Dr. Marcelle Overlie (neurology) due to my strong suspicion for GBS.  He recommended admission for IVIG and EMG.  Pt d/w Dr. Cyd Silence (triad) for admission.     Final Clinical Impression(s) / ED Diagnoses Final diagnoses:  Guillain Barr syndrome [facility])    Rx / DC Orders ED Discharge Orders     None        Isla Pence, MD 07/04/21 2209

## 2021-07-04 NOTE — ED Notes (Signed)
Pharmacy called to notify need of IVIG

## 2021-07-05 ENCOUNTER — Inpatient Hospital Stay (HOSPITAL_COMMUNITY): Payer: 59

## 2021-07-05 ENCOUNTER — Encounter (HOSPITAL_COMMUNITY): Payer: Self-pay | Admitting: Internal Medicine

## 2021-07-05 DIAGNOSIS — G610 Guillain-Barre syndrome: Secondary | ICD-10-CM

## 2021-07-05 DIAGNOSIS — G35 Multiple sclerosis: Secondary | ICD-10-CM | POA: Diagnosis not present

## 2021-07-05 DIAGNOSIS — E559 Vitamin D deficiency, unspecified: Secondary | ICD-10-CM | POA: Diagnosis not present

## 2021-07-05 DIAGNOSIS — E785 Hyperlipidemia, unspecified: Secondary | ICD-10-CM

## 2021-07-05 LAB — COMPREHENSIVE METABOLIC PANEL
ALT: 30 U/L (ref 0–44)
AST: 14 U/L — ABNORMAL LOW (ref 15–41)
Albumin: 3.7 g/dL (ref 3.5–5.0)
Alkaline Phosphatase: 65 U/L (ref 38–126)
Anion gap: 9 (ref 5–15)
BUN: 19 mg/dL (ref 6–20)
CO2: 24 mmol/L (ref 22–32)
Calcium: 8.9 mg/dL (ref 8.9–10.3)
Chloride: 102 mmol/L (ref 98–111)
Creatinine, Ser: 0.97 mg/dL (ref 0.61–1.24)
GFR, Estimated: >60 mL/min (ref >60)
Glucose, Bld: 91 mg/dL (ref 70–99)
Potassium: 3.4 mmol/L — ABNORMAL LOW (ref 3.5–5.1)
Sodium: 135 mmol/L (ref 135–145)
Total Bilirubin: 0.8 mg/dL (ref 0.3–1.2)
Total Protein: 7.8 g/dL (ref 6.5–8.1)

## 2021-07-05 LAB — CBC
HCT: 40.6 % (ref 39.0–52.0)
Hemoglobin: 13.7 g/dL (ref 13.0–17.0)
MCH: 27.2 pg (ref 26.0–34.0)
MCHC: 33.7 g/dL (ref 30.0–36.0)
MCV: 80.7 fL (ref 80.0–100.0)
Platelets: 251 10*3/uL (ref 150–400)
RBC: 5.03 MIL/uL (ref 4.22–5.81)
RDW: 13.6 % (ref 11.5–15.5)
WBC: 6.1 10*3/uL (ref 4.0–10.5)
nRBC: 0 % (ref 0.0–0.2)

## 2021-07-05 LAB — LIPID PANEL
Cholesterol: 327 mg/dL — ABNORMAL HIGH (ref 0–200)
HDL: 35 mg/dL — ABNORMAL LOW (ref >40)
LDL Cholesterol: 255 mg/dL — ABNORMAL HIGH (ref 0–99)
Total CHOL/HDL Ratio: 9.3 RATIO
Triglycerides: 183 mg/dL — ABNORMAL HIGH (ref <150)
VLDL: 37 mg/dL (ref 0–40)

## 2021-07-05 LAB — HEMOGLOBIN A1C
Hgb A1c MFr Bld: 5.7 % — ABNORMAL HIGH (ref 4.8–5.6)
Mean Plasma Glucose: 116.89 mg/dL

## 2021-07-05 LAB — HIV ANTIBODY (ROUTINE TESTING W REFLEX): HIV Screen 4th Generation wRfx: NONREACTIVE

## 2021-07-05 LAB — C-REACTIVE PROTEIN: CRP: 0.7 mg/dL (ref <1.0)

## 2021-07-05 IMAGING — MR MR HEAD WO/W CM
7 of 13 series · 23 of 48 positions shown · IV contrast (9.5 M GAD)
Comparison: None.

CLINICAL DATA: Neuro degenerative disorder suspected. Neuro
deficit.

EXAM:
MRI HEAD WITHOUT AND WITH CONTRAST
TECHNIQUE: Multiplanar, multiecho pulse sequences of the brain and surrounding
structures were obtained without and with intravenous contrast.
CONTRAST:  9.5mL GADAVIST GADOBUTROL 1 MMOL/ML IV SOLN

[Series 2: DWI · axial · 3.0mm · 0.94mm/px · z∈[-72,+75]mm · 8 of 100 slices shown (1 of 2)]
[im 1/100]
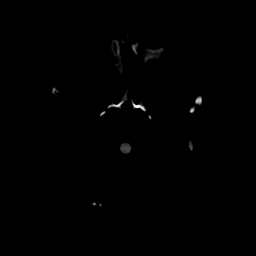
[im 15/100]
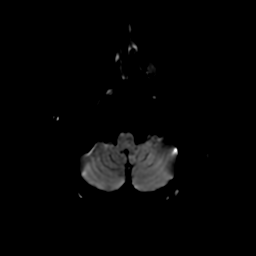
[im 29/100]
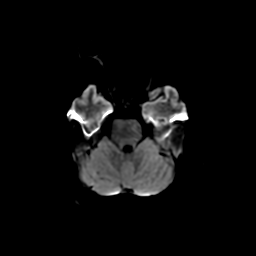
[im 43/100]
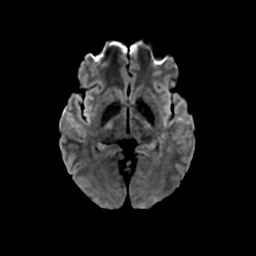
[im 57/100]
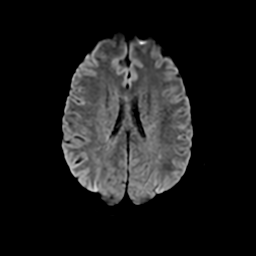
[im 71/100]
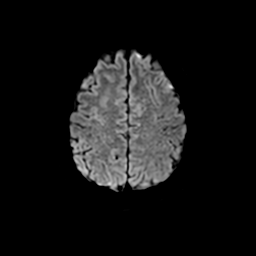
[im 85/100]
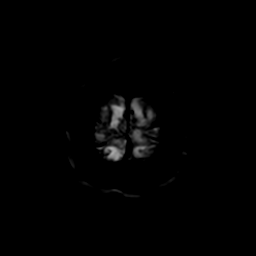
[im 100/100]
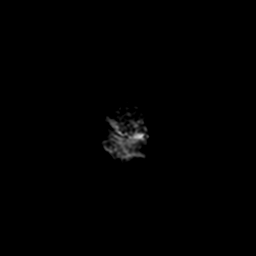

[Series 3: DWI · coronal · 4.0mm · 0.94mm/px · 5 of 74 slices shown (2 of 2)]
[im 1/74]
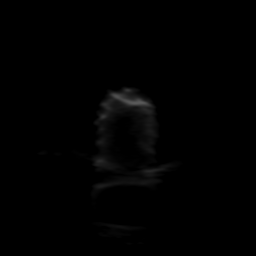
[im 19/74]
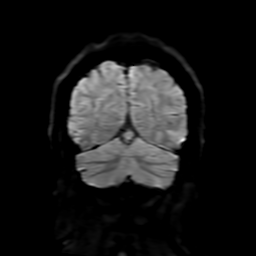
[im 37/74]
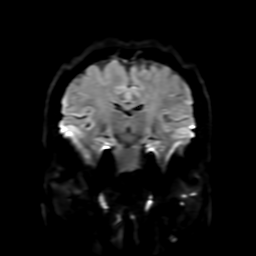
[im 55/74]
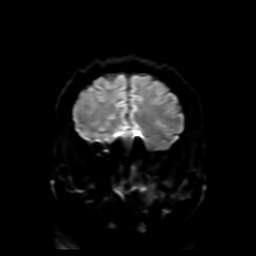
[im 74/74]
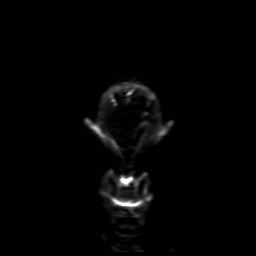

[Series 4: FLAIR · sagittal · 5.0mm · 0.23mm/px · 2 of 27 slices shown (1 of 2)]
[im 1/27]
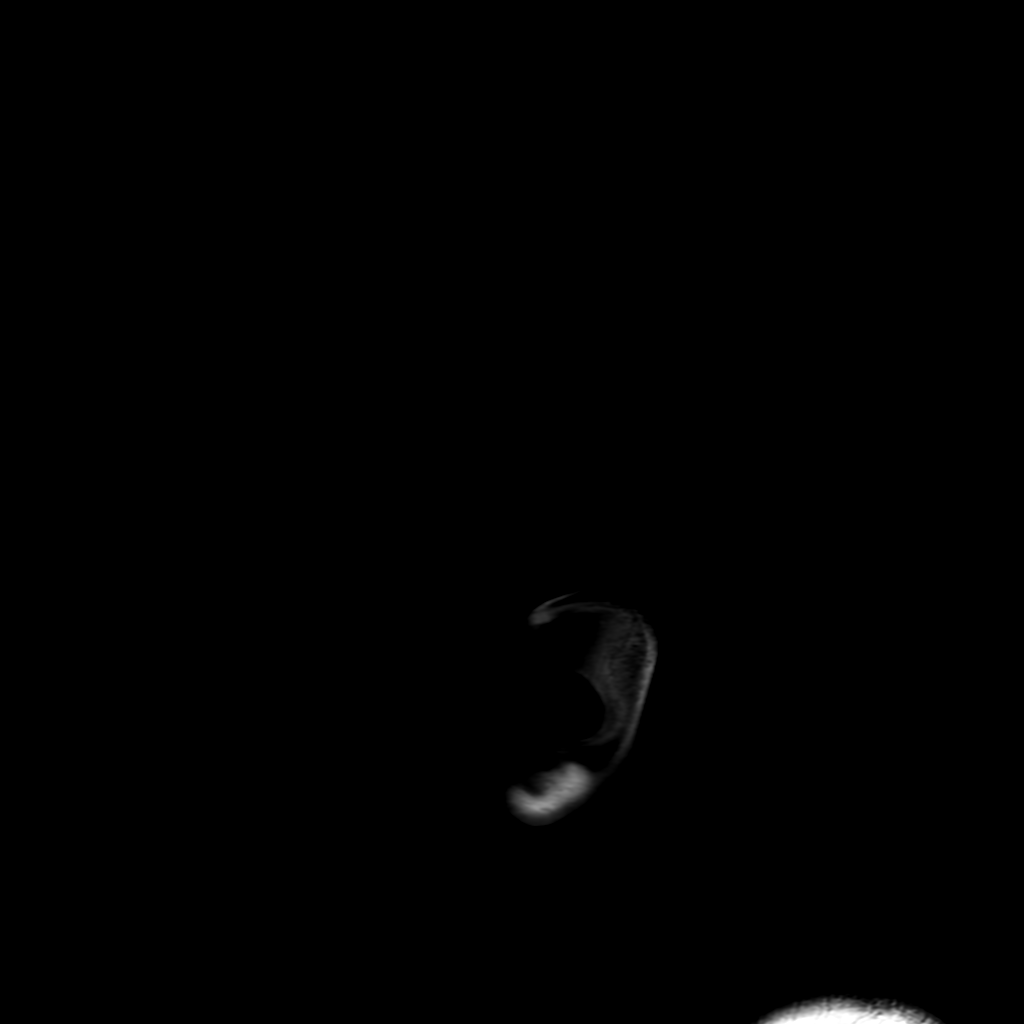
[im 27/27]
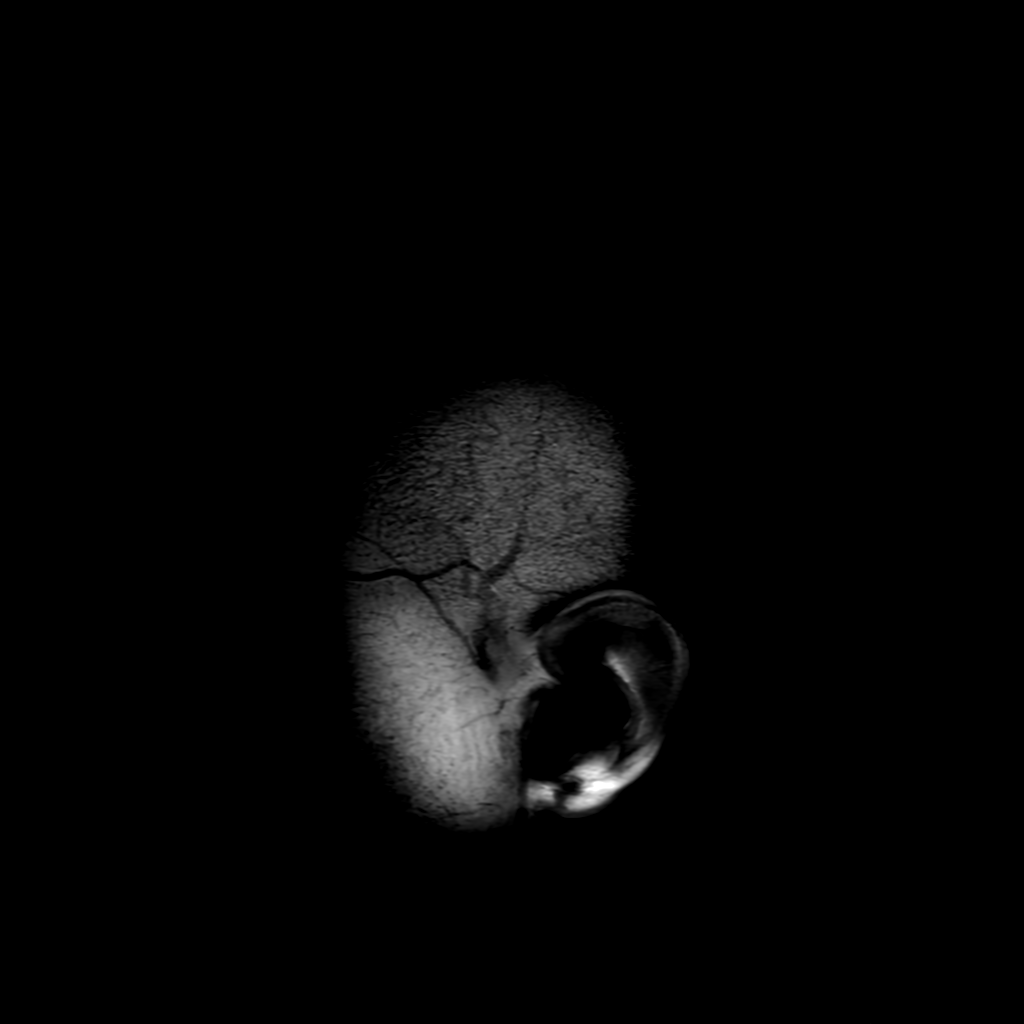

[Series 5: T2 · axial · 5.0mm · 0.23mm/px · 1 of 26 slices shown]
[im 1/26]
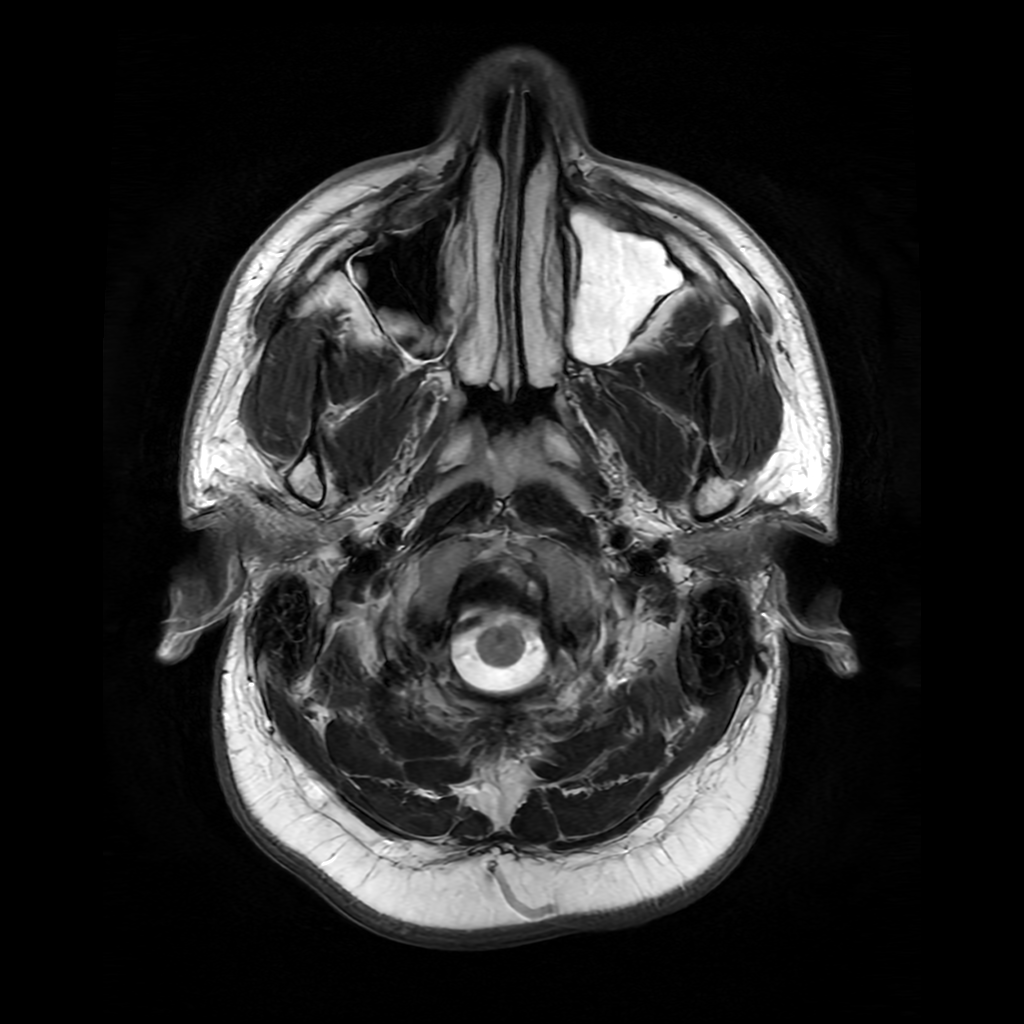

[Series 6: FLAIR · axial · 4.0mm · 0.45mm/px · z∈[-64,+86]mm · 2 of 35 slices shown (2 of 2)]
[im 1/35]
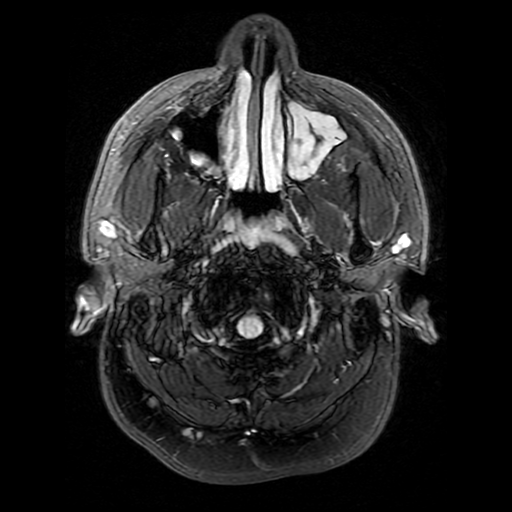
[im 35/35]
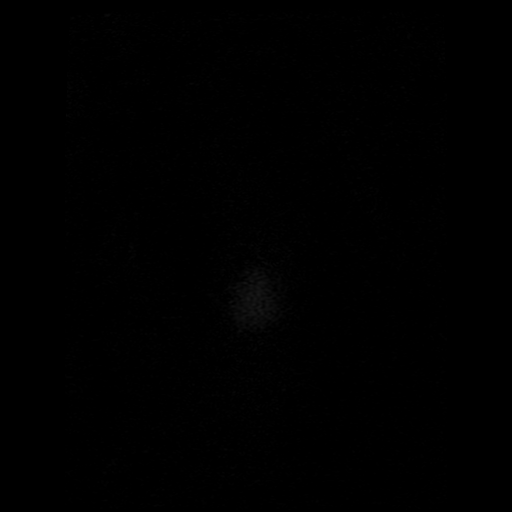

[Series 250: ADC · axial · 3.0mm · 0.94mm/px · z∈[-72,+75]mm · 3 of 49 slices shown (1 of 2)]
[im 1/49]
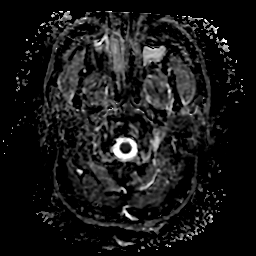
[im 25/49]
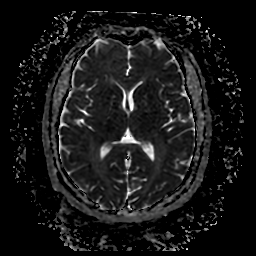
[im 49/49]
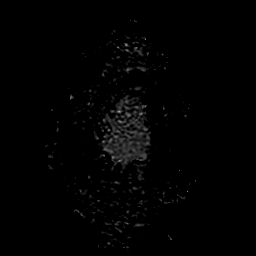

[Series 350: ADC · coronal · 4.0mm · 0.94mm/px · 2 of 37 slices shown (2 of 2)]
[im 1/37]
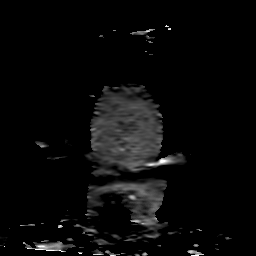
[im 37/37]
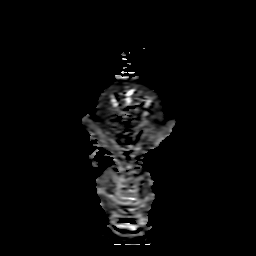

[23 of 48 positions shown; findings below may reference images not displayed]

FINDINGS: Brain: Extensive FLAIR hyperintensity in the cerebral white matter,
especially around the lateral ventricles with ovoid radiating
pattern and a few areas of nodular plaque-like enhancement.
Infratentorial involvement is also present the left deep cerebellum
(where there is also enhancement) and ventral right pons.
Juxtacortical plaques are also present. Brain volume is normal.

Vascular: Normal flow voids and vascular enhancements.

Skull and upper cervical spine: Normal marrow signal

Sinuses/Orbits: Complete opacification of the left maxillary sinus
primarily from submucosal edema or retention cyst by postcontrast
imaging. Mucosal thickening also seen in the posterior
ethmoid/sphenoid sinuses.
IMPRESSION: Demyelinating pattern which is extensive and shows signs of active
disease. Findings would be consistent with multiple sclerosis.

## 2021-07-05 IMAGING — MR MR THORACIC SPINE WO/W CM
5 of 9 series · 19 of 48 positions shown · IV contrast (gadavist)
Comparison: None.

CLINICAL DATA: Acute myelopathy

EXAM:
MRI THORACIC WITHOUT AND WITH CONTRAST
TECHNIQUE: Multiplanar and multiecho pulse sequences of the thoracic spine were
obtained without and with intravenous contrast.
CONTRAST:  9.5mL GADAVIST GADOBUTROL 1 MMOL/ML IV SOLN

[Series 17: T1 · sagittal · 3.0mm · 0.90mm/px · 1 of 16 slices shown (1 of 3)]
[im 1/16]
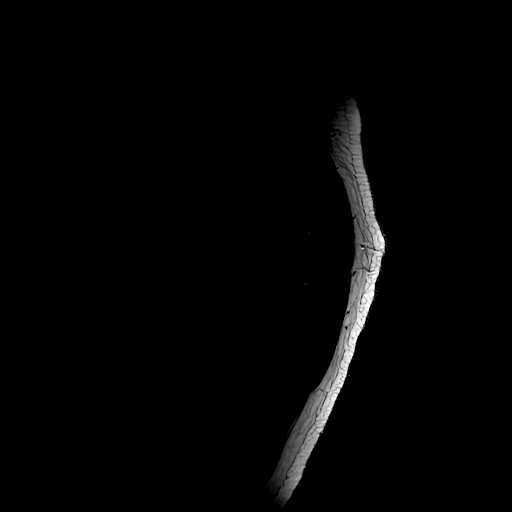

[Series 18: T2 · sagittal · 3.0mm · 0.66mm/px · 2 of 17 slices shown (1 of 2)]
[im 1/17]
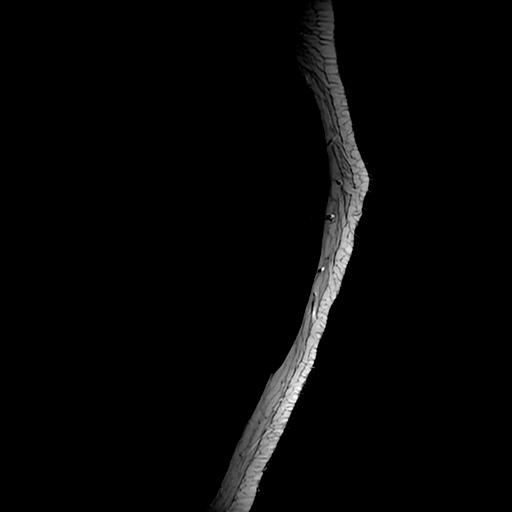
[im 17/17]
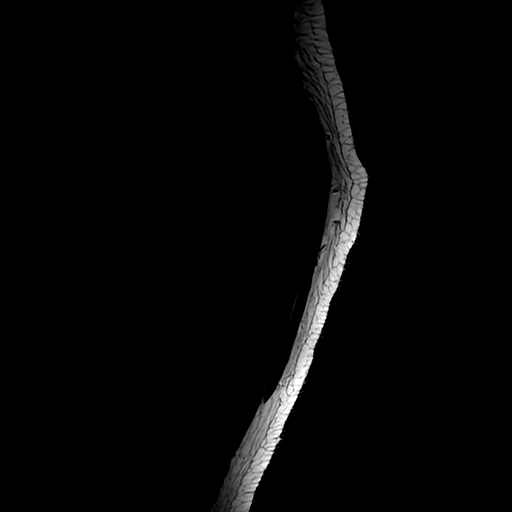

[Series 20: T1 · sagittal · 3.0mm · 0.66mm/px · 3 of 17 slices shown (2 of 3)]
[im 1/17]
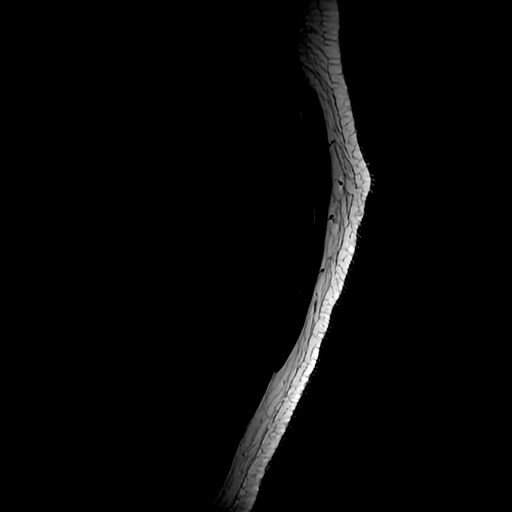
[im 9/17]
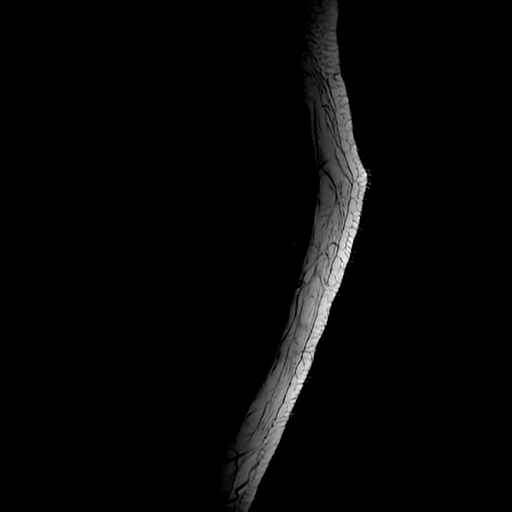
[im 17/17]
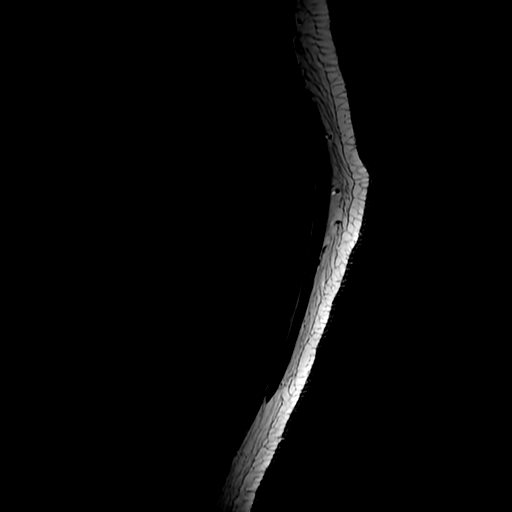

[Series 21: T2 · axial · 4.0mm · 0.39mm/px · z∈[-475,-177]mm · 9 of 57 slices shown (2 of 2)]
[im 1/57]
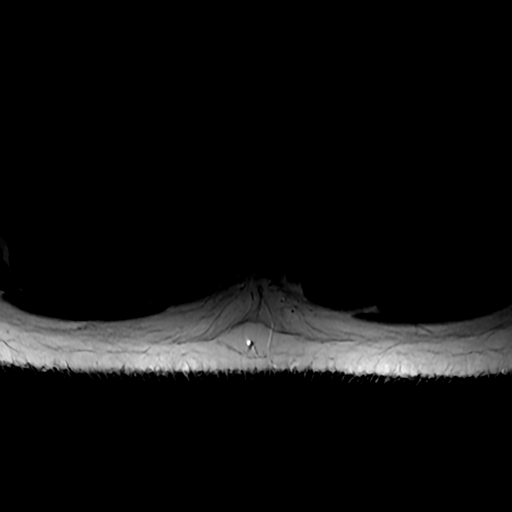
[im 8/57]
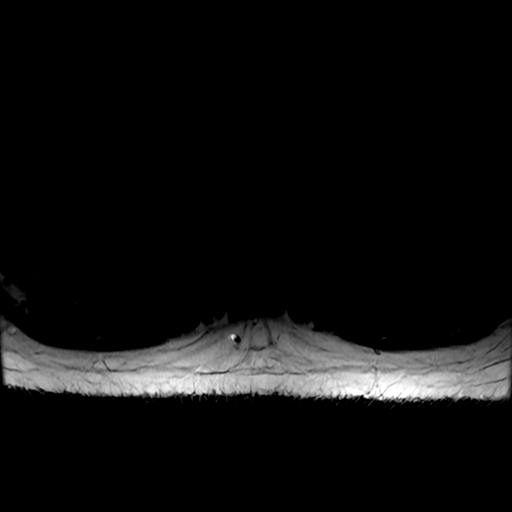
[im 15/57]
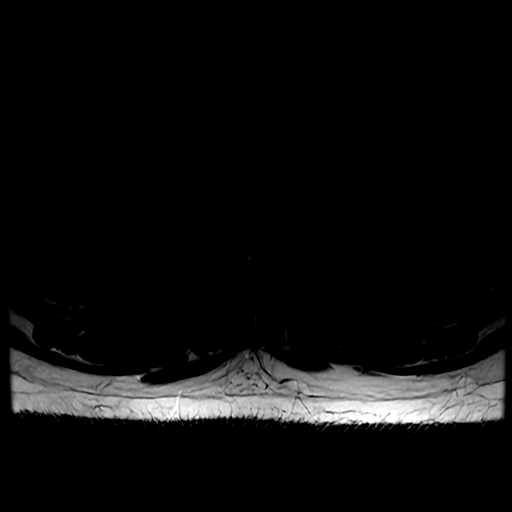
[im 22/57]
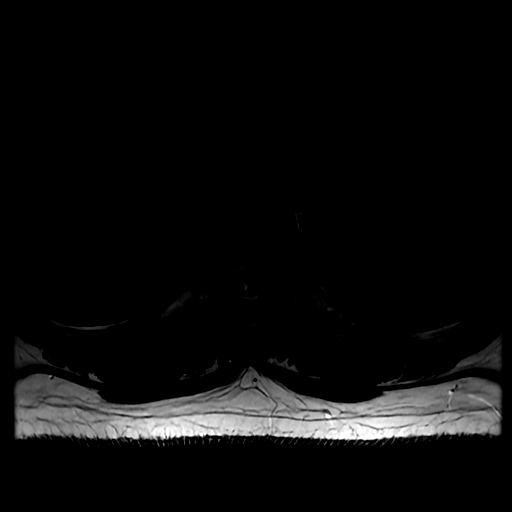
[im 29/57]
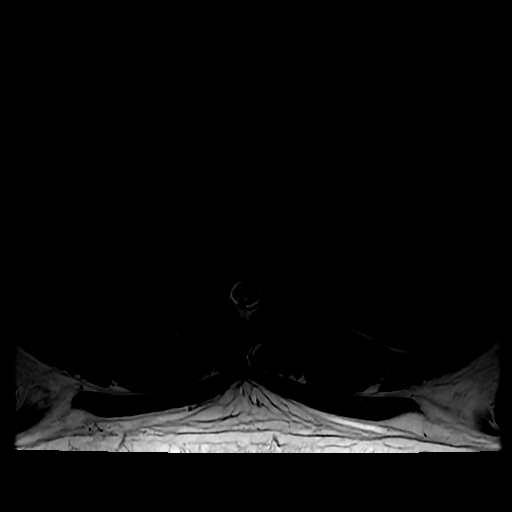
[im 36/57]
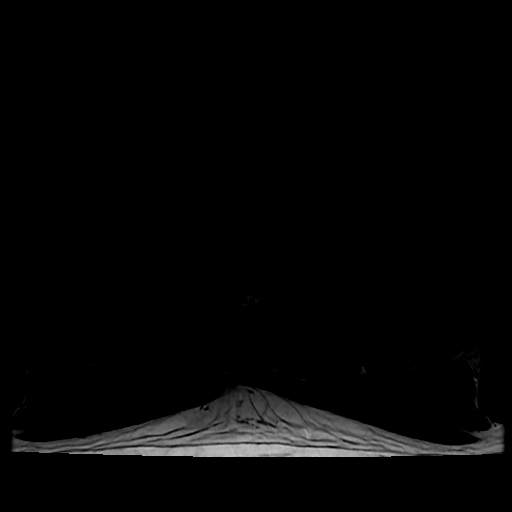
[im 43/57]
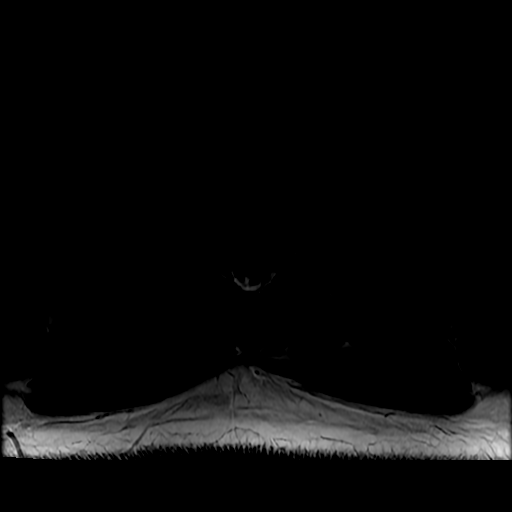
[im 50/57]
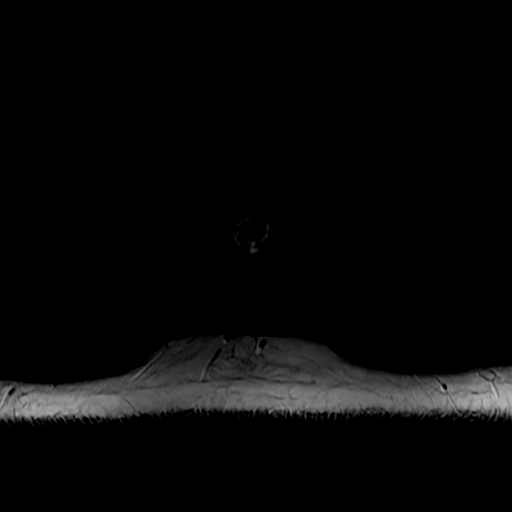
[im 57/57]
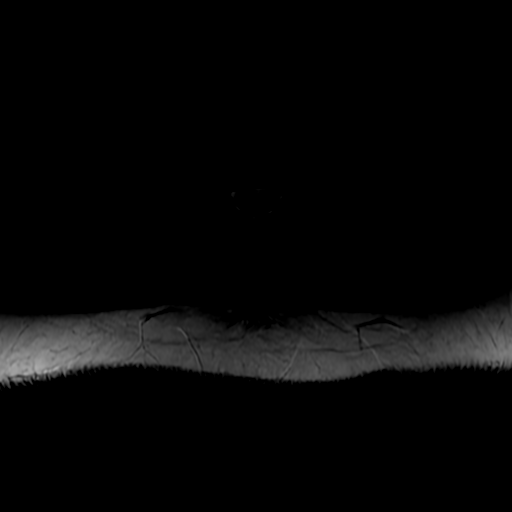

[Series 23: T1 · axial · non-contrast · 4.0mm · 0.39mm/px · z∈[-475,-363]mm · 4 of 57 slices shown (3 of 3)]
[im 1/57]
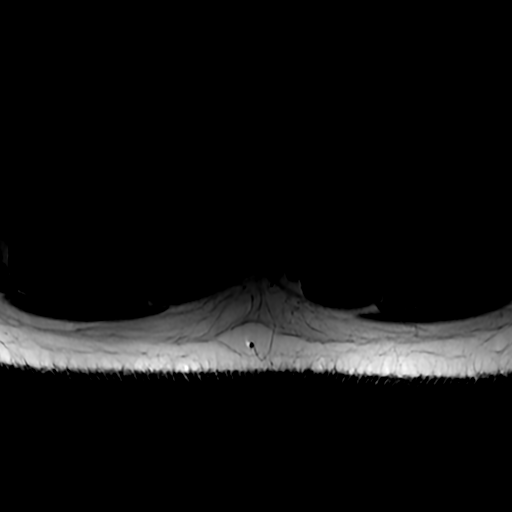
[im 8/57]
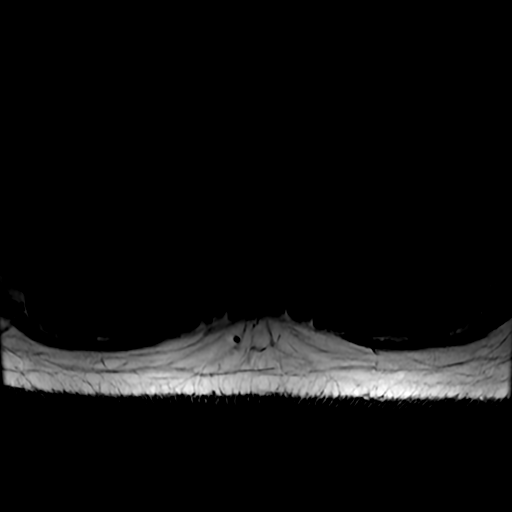
[im 15/57]
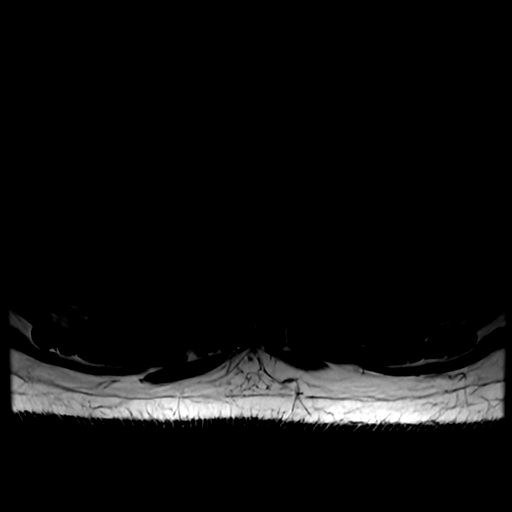
[im 22/57]
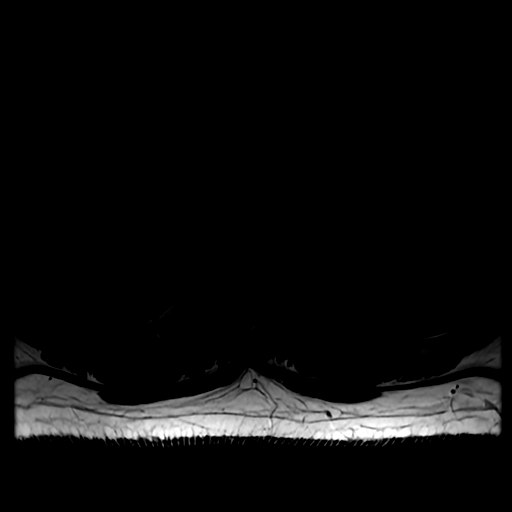

[19 of 48 positions shown; findings below may reference images not displayed]

FINDINGS: Alignment:  Unremarkable

Vertebrae: No fracture, evidence of discitis, or bone lesion.

Cord: Multiple short segment cord signal abnormalities in the right
cord at T1

Right cord at T2

Central cord at T3-4

Left posterior cord at T8

Hazily in the central cord at T9

Posterior cord at T112

Paraspinal and other soft tissues: Unremarkable

Disc levels:

Diffusely preserved disc height and hydration. No degenerative
impingement
IMPRESSION: Numerous cord signal abnormalities compatible with demyelinating
plaques. Enhancing plaques are seen at T2-3, T9, and T11.

## 2021-07-05 IMAGING — MR MR CERVICAL SPINE WO/W CM
5 of 8 series · 19 of 48 positions shown · IV contrast (gadavist)
Comparison: None.

CLINICAL DATA: Acute myelopathy

EXAM:
MRI CERVICAL SPINE WITHOUT AND WITH CONTRAST
TECHNIQUE: Multiplanar and multiecho pulse sequences of the cervical spine, to
include the craniocervical junction and cervicothoracic junction,
were obtained without and with intravenous contrast.
CONTRAST:  9.5mL GADAVIST GADOBUTROL 1 MMOL/ML IV SOLN

[Series 10: T2 · sagittal · 3.0mm · 0.35mm/px · 3 of 18 slices shown (1 of 2)]
[im 1/18]
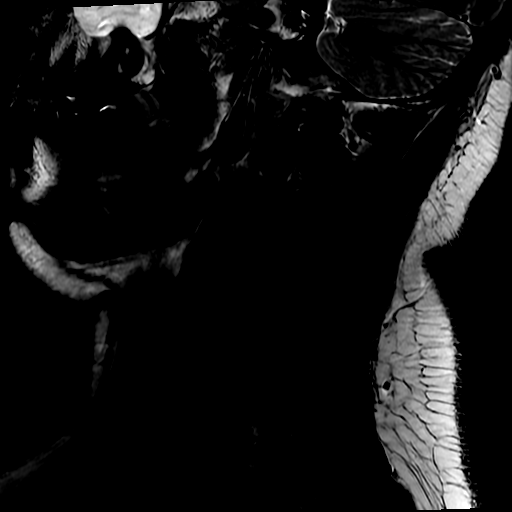
[im 9/18]
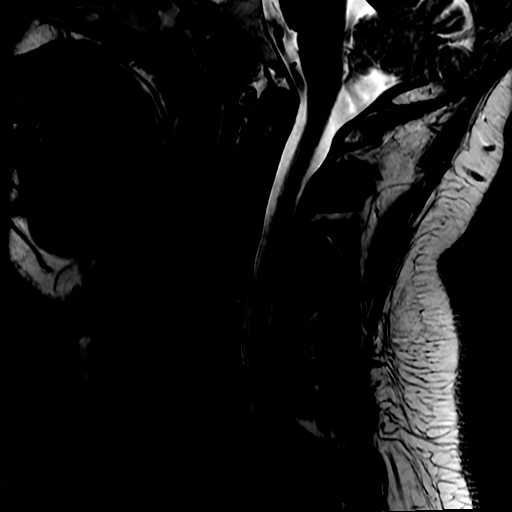
[im 18/18]
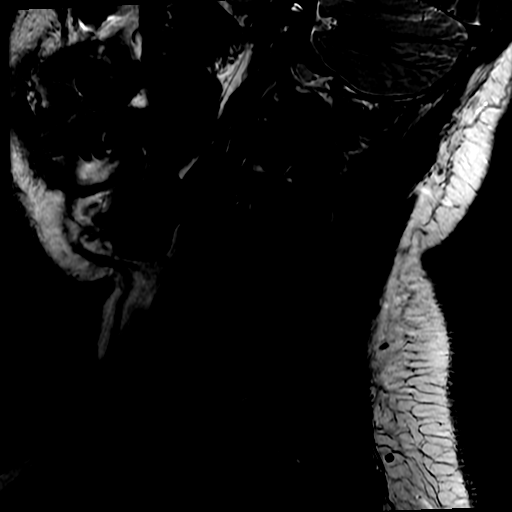

[Series 12: STIR · sagittal · 3.0mm · 0.35mm/px · 1 of 18 slices shown]
[im 1/18]
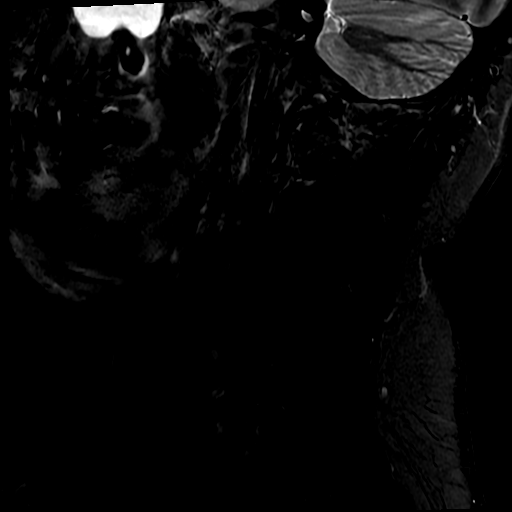

[Series 14: T2 · axial · 3.0mm · 0.35mm/px · z∈[-219,-97]mm · 6 of 39 slices shown (2 of 2)]
[im 1/39]
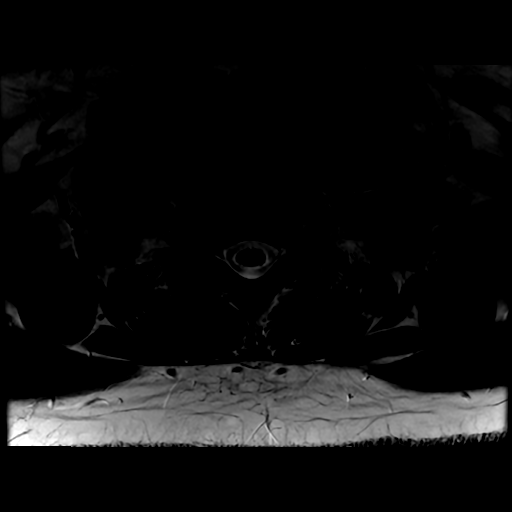
[im 8/39]
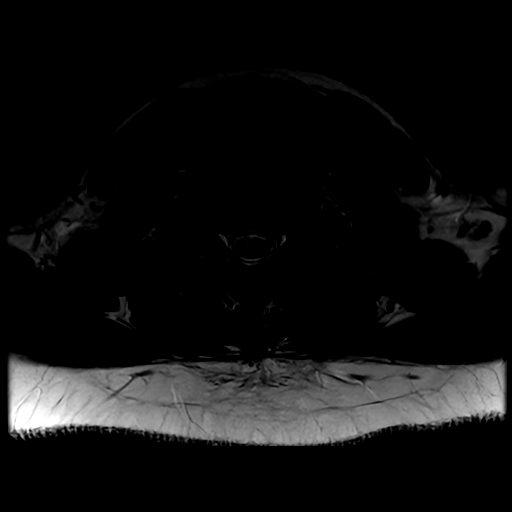
[im 16/39]
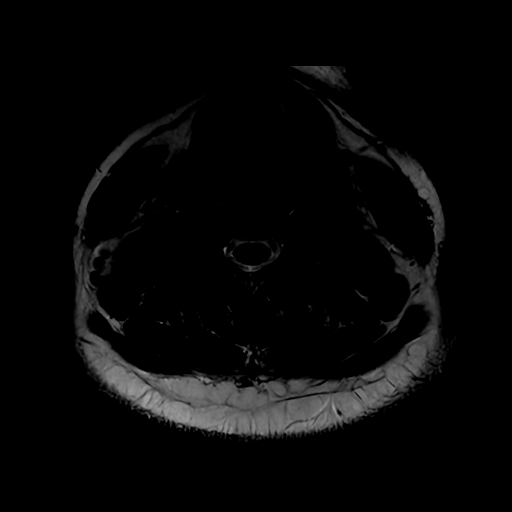
[im 23/39]
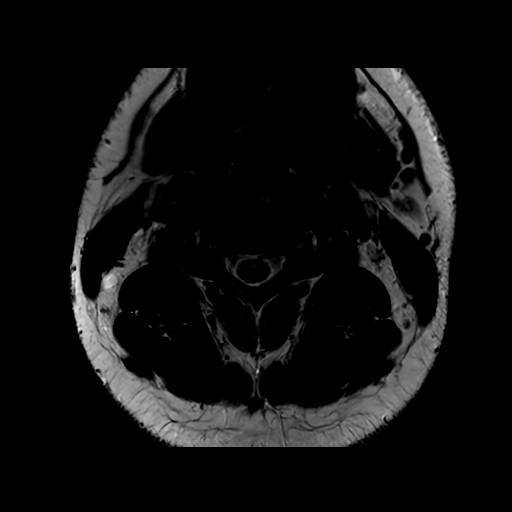
[im 31/39]
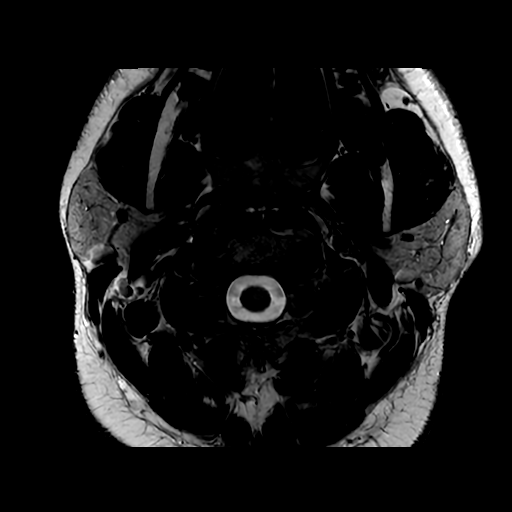
[im 39/39]
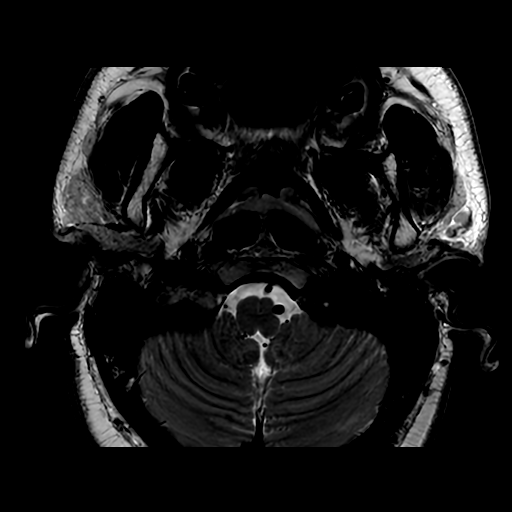

[Series 15: T1 · axial · non-contrast · 3.0mm · 0.35mm/px · z∈[-219,-97]mm · 6 of 39 slices shown]
[im 1/39]
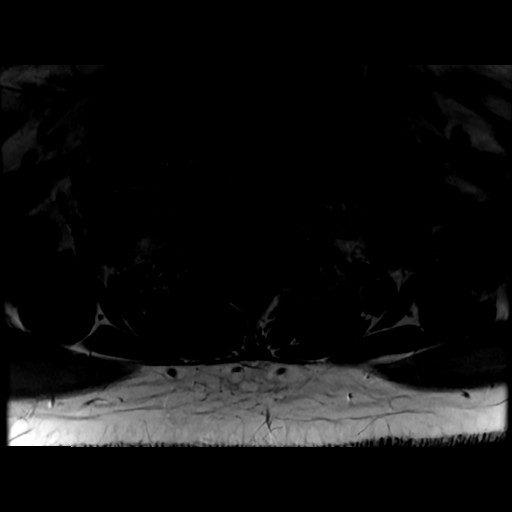
[im 8/39]
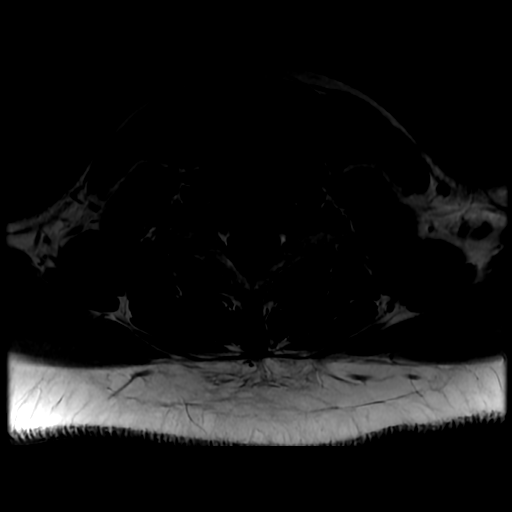
[im 16/39]
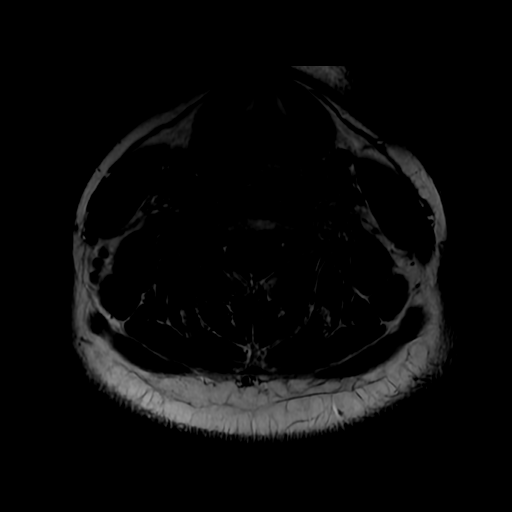
[im 23/39]
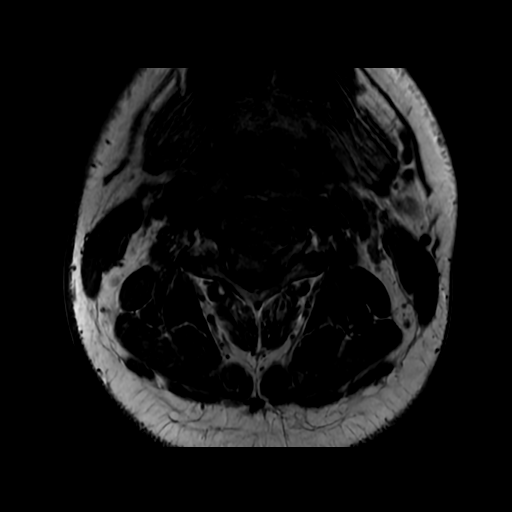
[im 31/39]
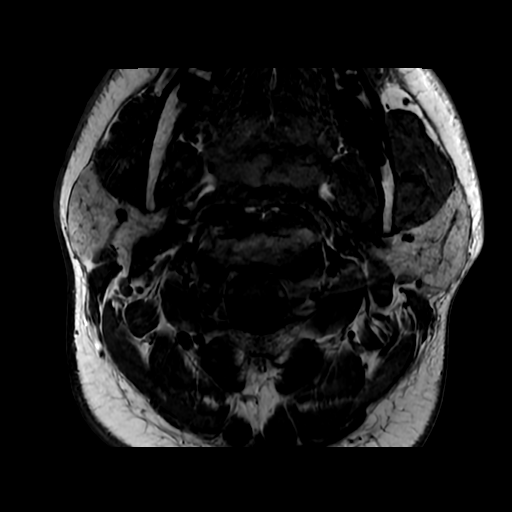
[im 39/39]
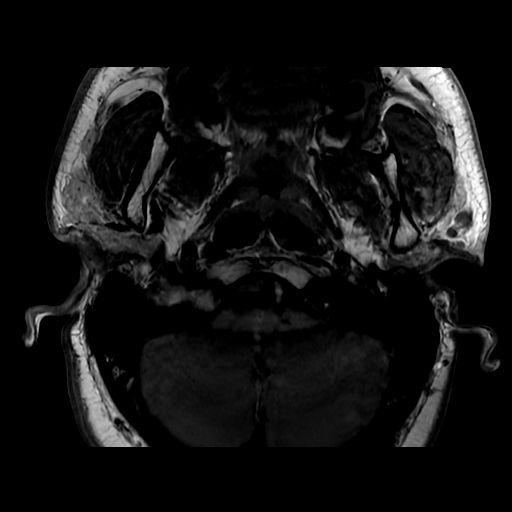

[Series 26: T1 fat-sat post-contrast · sagittal · 3.0mm · 0.35mm/px · 3 of 18 slices shown]
[im 1/18]
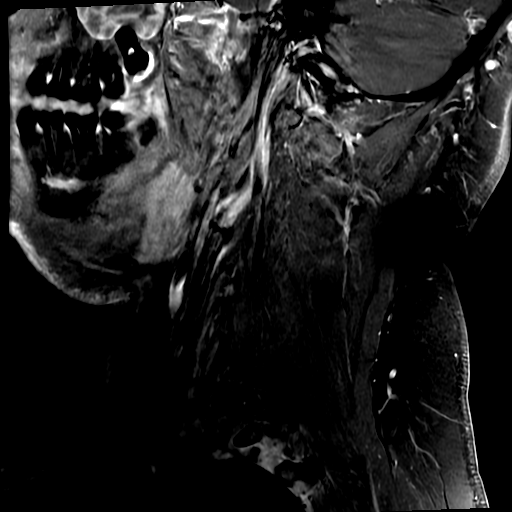
[im 9/18]
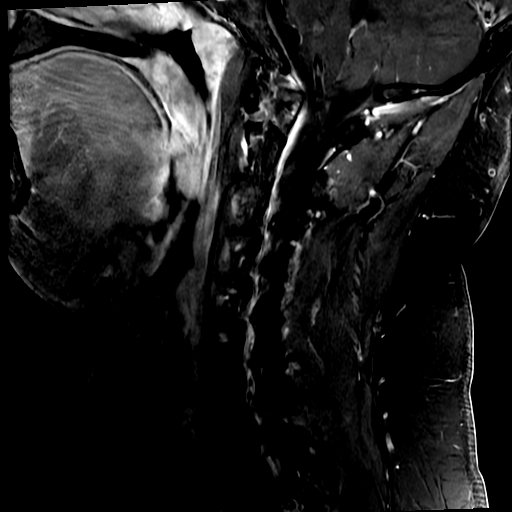
[im 18/18]
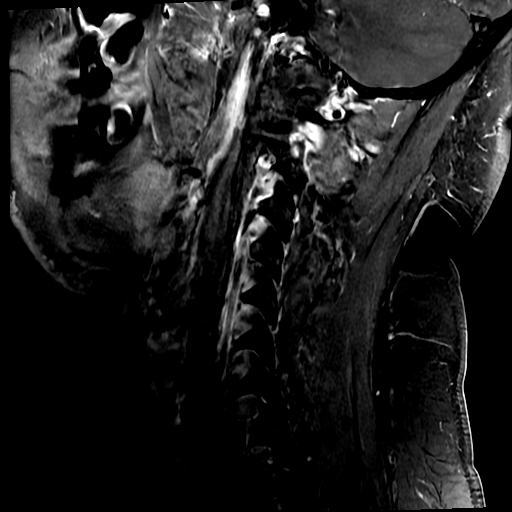

[19 of 48 positions shown; findings below may reference images not displayed]

FINDINGS: Alignment: Normal

Vertebrae: No fracture, evidence of discitis, or bone lesion.

Cord: Multiple short segment T2 hyperintensities seen in the ventral
cord at:

The central cord at C2-3

Dorsal and right lateral cord at C4

Central and right lateral cord at C6

Ventral cord at C6-7

Left-sided cord at C7.

Ill-defined enhancement is associated with the ventral cord plaque
at C6-7.

Posterior Fossa, vertebral arteries, paraspinal tissues: Negative

Disc levels:

Preserved disc height and hydration diffusely.  Negative facets.
IMPRESSION: Diffuse short segment cord signal abnormalities compatible with
demyelinating plaques. A plaque in the ventral cord at C6-7 is
enhancing.

## 2021-07-05 MED ORDER — ROSUVASTATIN CALCIUM 20 MG PO TABS
40.0000 mg | ORAL_TABLET | Freq: Every day | ORAL | Status: DC
  Administered 2021-07-05 – 2021-07-09 (×5): 40 mg via ORAL
  Filled 2021-07-05 (×5): qty 2

## 2021-07-05 MED ORDER — GADOBUTROL 1 MMOL/ML IV SOLN
9.5000 mL | Freq: Once | INTRAVENOUS | Status: AC | PRN
  Administered 2021-07-05: 9.5 mL via INTRAVENOUS

## 2021-07-05 MED ORDER — LORAZEPAM 2 MG/ML IJ SOLN
1.0000 mg | Freq: Once | INTRAMUSCULAR | Status: AC
  Administered 2021-07-05: 1 mg via INTRAVENOUS
  Filled 2021-07-05: qty 1

## 2021-07-05 MED ORDER — SODIUM CHLORIDE 0.9 % IV SOLN
1000.0000 mg | INTRAVENOUS | Status: AC
  Administered 2021-07-05 – 2021-07-09 (×5): 1000 mg via INTRAVENOUS
  Filled 2021-07-05 (×5): qty 16

## 2021-07-05 MED ORDER — ACETAMINOPHEN 650 MG RE SUPP: 650.0000 mg | Freq: Four times a day (QID) | RECTAL | Status: DC | PRN

## 2021-07-05 MED ORDER — ACETAMINOPHEN 325 MG PO TABS: 650.0000 mg | ORAL_TABLET | Freq: Four times a day (QID) | ORAL | Status: DC | PRN

## 2021-07-05 MED ORDER — IMMUNE GLOBULIN (HUMAN) 10 GM/100ML IV SOLN: 400.0000 mg/kg | INTRAVENOUS | Status: DC

## 2021-07-05 MED ORDER — SODIUM CHLORIDE 0.9% FLUSH: 3.0000 mL | INTRAVENOUS | Status: DC | PRN

## 2021-07-05 MED ORDER — SODIUM CHLORIDE 0.9% FLUSH
3.0000 mL | Freq: Two times a day (BID) | INTRAVENOUS | Status: DC
  Administered 2021-07-05 – 2021-07-09 (×10): 3 mL via INTRAVENOUS

## 2021-07-05 MED ORDER — SODIUM CHLORIDE 0.9 % IV SOLN: 250.0000 mL | INTRAVENOUS | Status: DC | PRN

## 2021-07-05 MED ORDER — PANTOPRAZOLE SODIUM 40 MG IV SOLR
40.0000 mg | INTRAVENOUS | Status: AC
  Administered 2021-07-05 – 2021-07-09 (×5): 40 mg via INTRAVENOUS
  Filled 2021-07-05 (×5): qty 40

## 2021-07-05 NOTE — ED Notes (Signed)
Called [facility] to transport patient to [facility] 4NP room 7

## 2021-07-05 NOTE — Plan of Care (Signed)
Neurology Plan of Care  Imaging reviewed:   MRI brain wwo contrast 12/17: Demyelinating pattern which is extensive and shows signs of active disease. Findings would be consistent with multiple sclerosis.  MR cervical spine wwo contrast 12/17: Diffuse short segment cord signal abnormalities compatible with demyelinating plaques. A plaque in the ventral cord at C6-7 is enhancing.  MR thoracic spine wwo 12/17: Numerous cord signal abnormalities compatible with demyelinating plaques. Enhancing plaques are seen at T2-3, T9, and T11.  Assessment / Plan: MRI imaging and patient presentation is consistent with active demyelination with multiple sclerosis.  Due to ongoing issues with saddle anesthesia, will obtain lumbar spine imaging and follow up. Started patient on 5 days of high dose Solu-Medrol therapy at 1,000 mg daily + PPI to be complete 12/21 Discussed with patient at bedside and all questions answered.   Lanae Boast, AGACNP-BC [facility] [phone]

## 2021-07-05 NOTE — Consult Note (Signed)
Neurology Consultation  Reason for Consult: Ascending weakness and numbness Referring Physician: Trey Paula, MD  CC: Ascending numbness and weakness  History is obtained from: Patient  HPI: Willie Archer is a 25 y.o. male with hx HTN, DM who presented to ED with b/l LE numbness.  Pt reports shuffling gait as he's unable to feel the ground.  Pt had URI symptoms 2 weeks ago.  Both flu and covid negative. Neuro was consulted to rule out GBS.   Past Medical History:  Diagnosis Date   Hyperlipidemia    Vitamin D deficiency    Family History  Problem Relation Age of Onset   Hypertension Mother    Diabetes Father     Social History:   reports that he has never smoked. He has never used smokeless tobacco. He reports current alcohol use. He reports that he does not use drugs.  Medications  Current Facility-Administered Medications:    0.9 %  sodium chloride infusion, 250 mL, Intravenous, PRN, Chotiner, Claudean Severance, MD   acetaminophen (TYLENOL) tablet 650 mg, 650 mg, Oral, Q6H PRN **OR** acetaminophen (TYLENOL) suppository 650 mg, 650 mg, Rectal, Q6H PRN, Chotiner, Claudean Severance, MD   Immune Globulin 10% (PRIVIGEN) IV infusion 40 g, 400 mg/kg, Intravenous, Q24 Hr x 5, Chotiner, Claudean Severance, MD, Last Rate: 80 mL/hr at 07/05/21 0225, Rate Change at 07/05/21 0225   rosuvastatin (CRESTOR) tablet 40 mg, 40 mg, Oral, Daily, Chotiner, Claudean Severance, MD   sodium chloride flush (NS) 0.9 % injection 3 mL, 3 mL, Intravenous, Q12H, Chotiner, Claudean Severance, MD   sodium chloride flush (NS) 0.9 % injection 3 mL, 3 mL, Intravenous, PRN, Chotiner, Claudean Severance, MD  ROS:    General ROS: negative for - chills, fatigue, fever, night sweats, weight gain or weight loss Psychological ROS: negative for - behavioral disorder, hallucinations, memory difficulties, mood swings or suicidal ideation Ophthalmic ROS: negative for - blurry vision, double vision, eye pain or loss of vision ENT ROS: negative for - epistaxis,  nasal discharge, oral lesions, sore throat, tinnitus or vertigo Allergy and Immunology ROS: negative for - hives or itchy/watery eyes Hematological and Lymphatic ROS: negative for - bleeding problems, bruising or swollen lymph nodes Endocrine ROS: negative for - galactorrhea, hair pattern changes, polydipsia/polyuria or temperature intolerance Respiratory ROS: cough Cardiovascular ROS: negative for - chest pain, dyspnea on exertion, edema or irregular heartbeat Gastrointestinal ROS: negative for - abdominal pain, diarrhea, hematemesis, nausea/vomiting or stool incontinence Genito-Urinary ROS: negative for - dysuria, hematuria, incontinence or urinary frequency/urgency Musculoskeletal ROS: negative for - joint swelling or muscular weakness Neurological ROS: as noted in HPI Dermatological ROS: negative for rash and skin lesion changes  Exam: Current vital signs: BP (!) 148/85 (BP Location: Right Arm)    Pulse 88    Temp 98 F (36.7 C) (Oral)    Resp 19    Ht 5\' 8"  (1.727 m)    Wt 96.2 kg    SpO2 99%    BMI 32.25 kg/m  Vital signs in last 24 hours: Temp:  [98 F (36.7 C)-98.2 F (36.8 C)] 98 F (36.7 C) (12/17 0224) Pulse Rate:  [73-98] 88 (12/17 0147) Resp:  [16-20] 19 (12/17 0224) BP: (124-154)/(79-92) 148/85 (12/17 0224) SpO2:  [99 %-100 %] 99 % (12/17 0147) Weight:  [96.2 kg-98.4 kg] 96.2 kg (12/17 0147)   Constitutional: Appears well-developed and well-nourished.  Psych: Affect appropriate to situation Eyes: No scleral injection HENT: No OP obstrucion Head: Normocephalic.  Cardiovascular: Normal rate and  regular rhythm.  Respiratory: Effort normal, non-labored breathing GI: Soft.  No distension. There is no tenderness.  Skin: WDI  Neuro: Mental Status: AAOx3, fluent speech, no dysarthria, follows commands EOMI, PERRL, no facial asymmetry Tongue and palate midline Nl bulk and tone, no drift Moves all ext antigravity Diminished reflexes b/l ankle Decreased  sensation   Labs I have reviewed labs in epic and the results pertinent to this consultation are:  CBC    Component Value Date/Time   WBC 6.6 07/04/2021 2000   RBC 5.48 07/04/2021 2000   HGB 14.6 07/04/2021 2000   HCT 43.4 07/04/2021 2000   PLT 300 07/04/2021 2000   MCV 79.2 (L) 07/04/2021 2000   MCH 26.6 07/04/2021 2000   MCHC 33.6 07/04/2021 2000   RDW 13.6 07/04/2021 2000   LYMPHSABS 2.1 07/04/2021 2000   MONOABS 0.5 07/04/2021 2000   EOSABS 0.1 07/04/2021 2000   BASOSABS 0.0 07/04/2021 2000    CMP     Component Value Date/Time   NA 138 07/04/2021 2000   K 3.4 (L) 07/04/2021 2000   CL 101 07/04/2021 2000   CO2 27 07/04/2021 2000   GLUCOSE 99 07/04/2021 2000   BUN 19 07/04/2021 2000   CREATININE 0.99 07/04/2021 2000   CREATININE 0.97 03/20/2016 1115   CALCIUM 9.7 07/04/2021 2000   PROT 7.8 07/04/2021 2000   ALBUMIN 4.7 07/04/2021 2000   AST 16 07/04/2021 2000   ALT 30 07/04/2021 2000   ALKPHOS 61 07/04/2021 2000   BILITOT 0.5 07/04/2021 2000   GFRNONAA >60 07/04/2021 2000   GFRNONAA >89 03/20/2016 1115   GFRAA >89 03/20/2016 1115    Lipid Panel     Component Value Date/Time   CHOL 311 (H) 03/20/2016 1115   TRIG 617 (H) 03/20/2016 1115   HDL 38 (L) 03/20/2016 1115   CHOLHDL 8.2 (H) 03/20/2016 1115   VLDL NOT CALC 03/20/2016 1115   LDLCALC NOT CALC 03/20/2016 1115     Imaging   Assessment: 25 yo M with hx HTN, DM p/w with ascending paralysis 2 weeks after experiencing URI symptoms   Impression: Possible GBS  Recommendations: Lumbar puncture Start IVIG x 5 days Consider NCS Check NIF and VC Respiratory watch Neuro will cont to follow  Total time spent 84min  Ray Church, MD Attending Neurologist

## 2021-07-05 NOTE — ED Notes (Signed)
[facility] called for report ETA 10 min 

## 2021-07-05 NOTE — Progress Notes (Signed)
Pt admitted earlier this am by Dr Rachael Darby.    Willie Archer is a 25 y.o. male with medical history significant for HLD who presents from [facility] ER for evaluation of GBS.  He reports he been having difficulty walking for the last few days.  He states that it feels like his right leg is weak and he has been having to drag his foot.  He states that for the last 3 to 4 days he has had a shuffling gait and he has noticed numbness in his feet.  He states he cannot feel the ground so he just shuffles and that is picking up his feet when he walks.  The numbness is bilaterally but it seems to be more pronounced with weakness in the right leg than the left.  He went to an urgent care and then was referred to the [facility] ER.  He states that 2 weeks ago he had a mild upper respiratory infection with some nasal congestion and a dry cough that resolved on its own.  He did take a flu and COVID test that was negative at that time.  Reports he has never had symptoms like this in the past.   Neurology consulted and stat imaging of the brain with MRI BRAIN and cervical and thoracic spine ordered.  He was found to have Demyelinating pattern which is extensive and shows signs of active disease. Probably secondary to Multiple Sclerosis.  Numerous cord signal abnormalities compatible with demyelinating plaques. Enhancing plaques are seen at T2-3, T9, and T11. Diffuse short segment cord signal abnormalities compatible with demyelinating plaques. A plaque in the ventral cord at C6-7 is enhancing.  He was started on high dose steroids with IV solumedrol 1g for 5 days for Multiple Sclerosis .  Pt seen, appears to be sleeping from the ativan given for the MRI.  Will continue to monitor.     Kathlen Mody MD

## 2021-07-05 NOTE — Plan of Care (Signed)
Neurology Plan of Care  On assessment this morning, patient is extremely hyperreflexic with increased tone of bilateral lower extremities. Attending MD completed assessment at bedside and discussed plan with neurology NP and patient.  Plan: - Hold lumbar puncture procedure at this time - Hold further IVIG doses pending imaging - STAT MRI brain, cervical, and thoracic spine imaging with and without contrast ordered  - Further recommendations pending MRI imaging results.  Lanae Boast, AGACNP-BC [facility] [phone]

## 2021-07-05 NOTE — H&P (Signed)
History and Physical    Willie Archer PPI:[id number] DOB: [birth year] DOA: 07/04/2021  PCP: Lucky Cowboy, MD   Patient coming from: Home  Chief Complaint: weakness of legs, difficulty walking  HPI: Willie Archer is a 25 y.o. male with medical history significant for HLD who presents from [facility] ER for evaluation of GBS.  He reports he been having difficulty walking for the last few days.  He states that it feels like his right leg is weak and he has been having to drag his foot.  He states that for the last 3 to 4 days he has had a shuffling gait and he has noticed numbness in his feet.  He states he cannot feel the ground so he just shuffles and that is picking up his feet when he walks.  The numbness is bilaterally but it seems to be more pronounced with weakness in the right leg than the left.  He went to an urgent care and then was referred to the [facility] ER.  He states that 2 weeks ago he had a mild upper respiratory infection with some nasal congestion and a dry cough that resolved on its own.  He did take a flu and COVID test that was negative at that time.  Reports he has never had symptoms like this in the past.  He states he has not had any difficulty breathing.  He does state that since yesterday his abdomen has had a funny feeling when he touches it and it feels like it is pins-and-needles to touch on his skin of his abdomen.  He does not have any intra-abdominal pain and he has not had any nausea vomiting or diarrhea.  He states he has not had any fever. He works in IT here at the hospital.  He has not been around any known sick contacts.  Denies tobacco alcohol or illicit drug use.  ED Course: [name] has been hemodynamically stable in the emergency room and since arrival here in the hospital.  He did have decreased feeling in his legs more pronounced on the right than the left with weakness in the right leg.  ER physician discussed with neurology who was concerned  with Katheran Awe syndrome and had patient started on IVIG with plan to admit for further work-up with EMG and possible LP.  Neurology is consulted and will see patient after arrival here at the hospital.  Review of Systems:  General: Denies fever, chills, weight loss, night sweats.  Denies dizziness.  Denies change in appetite HENT: Denies head trauma, headache, denies change in hearing, tinnitus.  Denies nasal congestion or bleeding.  Denies sore throat.  Denies difficulty swallowing Eyes: Denies blurry vision, pain in eye, drainage.  Denies discoloration of eyes. Neck: Denies pain.  Denies swelling.  Denies pain with movement. Cardiovascular: Denies chest pain, palpitations.  Denies edema.  Denies orthopnea Respiratory: Denies shortness of breath, cough.  Denies wheezing.  Denies sputum production Gastrointestinal: Denies abdominal pain, swelling.  Denies nausea, vomiting, diarrhea.  Denies melena.  Denies hematemesis. Musculoskeletal: Denies limitation of movement.  Denies deformity or swelling. Denies arthralgias or myalgias. Genitourinary: Denies pelvic pain. Denies urinary frequency or hesitancy.  Denies dysuria.  Skin: Denies rash.  Denies petechiae, purpura, ecchymosis. Neurological: Denies syncope. Denies seizure activity. Denies slurred speech, drooping face.  Denies visual change. Psychiatric: Denies depression, anxiety. Denies hallucinations.  Past Medical History:  Diagnosis Date   Hyperlipidemia    Vitamin D deficiency     Past  Surgical History:  Procedure Laterality Date   ACHILLES TENDON SURGERY Bilateral 2007   Dr Cheryll Cockayne    Social History  reports that he has never smoked. He has never used smokeless tobacco. He reports current alcohol use. He reports that he does not use drugs.  No Known Allergies  Family History  Problem Relation Age of Onset   Hypertension Mother    Diabetes Father      Prior to Admission medications   Medication Sig Start Date End Date  Taking? Authorizing Provider  Cholecalciferol (VITAMIN D PO) Take 5,000 Int'l Units by mouth daily. Taking 2 daily    [provider]  influenza vac split quadrivalent PF (FLUARIX) 0.5 ML injection Inject 0.5 mLs into the muscle. 05/12/21   Judyann Munson, MD  rosuvastatin (CRESTOR) 40 MG tablet Take1 tablet daily for Cholesterol 03/20/16 09/18/16  Lucky Cowboy, MD    Physical Exam: Vitals:   07/05/21 0045 07/05/21 0100 07/05/21 0147 07/05/21 0224  BP: 129/84 127/83 (!) 142/92 (!) 148/85  Pulse: 78 83 88   Resp: 20 17 19 19   Temp:   98.1 F (36.7 C) 98 F (36.7 C)  TempSrc:   Oral Oral  SpO2: 100% 100% 99%   Weight:   96.2 kg   Height:        Constitutional: NAD, calm, comfortable Vitals:   07/05/21 0045 07/05/21 0100 07/05/21 0147 07/05/21 0224  BP: 129/84 127/83 (!) 142/92 (!) 148/85  Pulse: 78 83 88   Resp: 20 17 19 19   Temp:   98.1 F (36.7 C) 98 F (36.7 C)  TempSrc:   Oral Oral  SpO2: 100% 100% 99%   Weight:   96.2 kg   Height:       General: WDWN, Alert and oriented x3.  Eyes: EOMI, PERRL, conjunctivae normal.  Sclera nonicteric HENT:  [place]/AT, external ears normal.  Nares patent without epistasis.  Mucous membranes are moist. Neck: Soft, normal range of motion, supple, no masses, Trachea midline Respiratory: clear to auscultation bilaterally, no wheezing, no crackles. Normal respiratory effort. No accessory muscle use.  Cardiovascular: Regular rate and rhythm, no murmurs / rubs / gallops. No extremity edema. 2+ pedal pulses. Abdomen: Soft, no tenderness, nondistended, no rebound or guarding.  No masses palpated. Obese. Bowel sounds normoactive Musculoskeletal: FROM. no cyanosis. No joint deformity upper and lower extremities. Normal muscle tone.  Skin: Warm, dry, intact no rashes, lesions, ulcers. No induration Neurologic: CN 2-12 grossly intact.  Normal speech.  Sensation decreased in legs-left more than right, patella DTR hyperreflexic on right and trace  on left. Strength 5/5 upper extremities, strength 3/5 in lower extremities. No tremor.  Psychiatric: Normal judgment and insight.  Normal mood.    Labs on Admission: I have personally reviewed following labs and imaging studies  CBC: Recent Labs  Lab 07/04/21 2000  WBC 6.6  NEUTROABS 3.8  HGB 14.6  HCT 43.4  MCV 79.2*  PLT 300    Basic Metabolic Panel: Recent Labs  Lab 07/04/21 2000  NA 138  K 3.4*  CL 101  CO2 27  GLUCOSE 99  BUN 19  CREATININE 0.99  CALCIUM 9.7    GFR: Estimated Creatinine Clearance: 126 mL/min (by C-G formula based on SCr of 0.99 mg/dL).  Liver Function Tests: Recent Labs  Lab 07/04/21 2000  AST 16  ALT 30  ALKPHOS 61  BILITOT 0.5  PROT 7.8  ALBUMIN 4.7    Urine analysis:    Component Value Date/Time  COLORURINE CANCELED 03/20/2016 1115   APPEARANCEUR CANCELED 03/20/2016 1115   LABSPEC CANCELED 03/20/2016 1115   PHURINE CANCELED 03/20/2016 1115   GLUCOSEU CANCELED 03/20/2016 1115   HGBUR CANCELED 03/20/2016 1115   BILIRUBINUR CANCELED 03/20/2016 1115   KETONESUR CANCELED 03/20/2016 1115   PROTEINUR CANCELED 03/20/2016 1115   NITRITE CANCELED 03/20/2016 1115   LEUKOCYTESUR CANCELED 03/20/2016 1115    Radiological Exams on Admission: No results found.  Assessment/Plan Principal Problem:   Guillain Barr syndrome  Mr. Fujiwara is admitted to MedSurg floor.  He has been started on IVIG and will continue this for 5 days. Neurology has been consulted and will evaluate patient.  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     Hyperlipidemia Continue Crestor.  Check lipid panel.   DVT prophylaxis: Padua score low; TED hose provided  Code Status:   Full Code  Family Communication:  Diagnosis and plan discussed with patient and his mother who is at bedside.  He verbalized understanding  agrees with plan.  Questions answered.  Further recommendations to follow as clinical indicated Disposition Plan:   Patient is from:  Home  Anticipated DC to:  Home  Anticipated DC date:  Anticipate 2 midnight or more stay to treat acute condition  Consults called:  Neurology, Dr. Scherrie November  Admission status:  Inpatient  Claudean Severance Aikam Hellickson MD [facility]  How to contact the [facility] Attending or Consulting provider 7A - 7P or covering provider during after hours 7P -7A, for this patient?   Check the care team in [facility] and look for a) attending/consulting [facility] provider listed and b) the [facility] team listed Log into [email or web address] and use [place]'s universal password to access. If you do not have the password, please contact the hospital operator. Locate the [facility] provider you are looking for under [facility] and page to a number that you can be directly reached. If you still have difficulty reaching the provider, please page the [facility] (Director on Call) for the Hospitalists listed on amion for assistance.  07/05/2021, 2:30 AM

## 2021-07-05 NOTE — ED Notes (Signed)
Report called to Janelle Floor, RN on 4NP.  Pt stable for transfer, [facility] to continue  in route.

## 2021-07-06 ENCOUNTER — Inpatient Hospital Stay (HOSPITAL_COMMUNITY): Payer: 59

## 2021-07-06 DIAGNOSIS — G35 Multiple sclerosis: Secondary | ICD-10-CM | POA: Diagnosis present

## 2021-07-06 LAB — BASIC METABOLIC PANEL
Anion gap: 10 (ref 5–15)
BUN: 16 mg/dL (ref 6–20)
CO2: 20 mmol/L — ABNORMAL LOW (ref 22–32)
Calcium: 9.4 mg/dL (ref 8.9–10.3)
Chloride: 104 mmol/L (ref 98–111)
Creatinine, Ser: 0.88 mg/dL (ref 0.61–1.24)
GFR, Estimated: >60 mL/min (ref >60)
Glucose, Bld: 146 mg/dL — ABNORMAL HIGH (ref 70–99)
Potassium: 4.5 mmol/L (ref 3.5–5.1)
Sodium: 134 mmol/L — ABNORMAL LOW (ref 135–145)

## 2021-07-06 LAB — VITAMIN B12: Vitamin B-12: 392 pg/mL (ref 180–914)

## 2021-07-06 IMAGING — MR MR LUMBAR SPINE WO/W CM
4 of 7 series · 24 of 48 positions shown · IV contrast (MH)
Comparison: None.

CLINICAL DATA: Demyelinating disease

EXAM:
MRI LUMBAR SPINE WITHOUT AND WITH CONTRAST
TECHNIQUE: Multiplanar and multiecho pulse sequences of the lumbar spine were
obtained without and with intravenous contrast.
CONTRAST:  10mL GADAVIST GADOBUTROL 1 MMOL/ML IV SOLN

[Series 5: T2 · sagittal · 4.0mm · 0.73mm/px · 5 of 16 slices shown (1 of 2)]
[im 1/16]
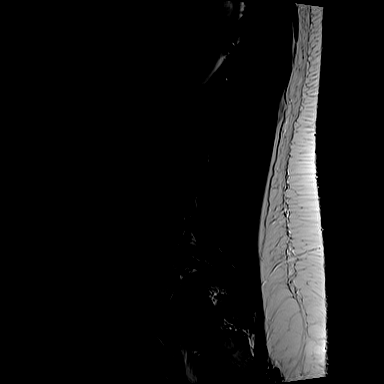
[im 4/16]
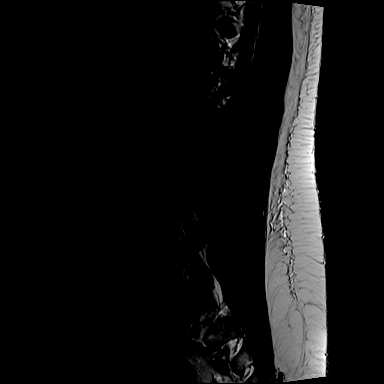
[im 8/16]
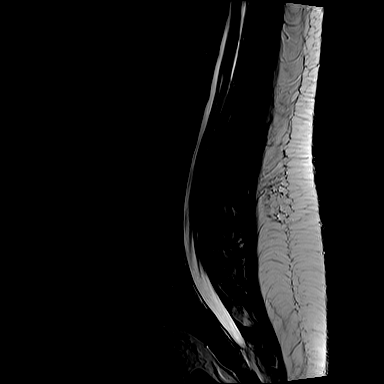
[im 12/16]
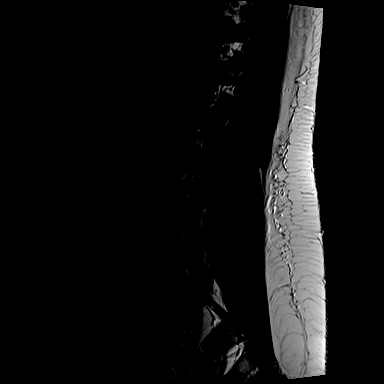
[im 16/16]
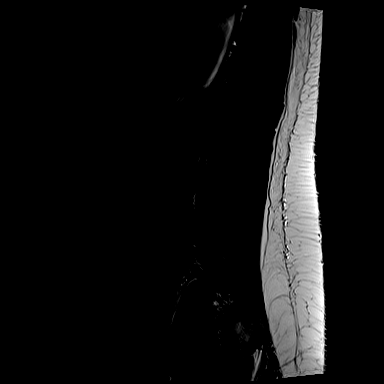

[Series 7: T1 · sagittal · 4.0mm · 0.88mm/px · 4 of 16 slices shown (1 of 2)]
[im 1/16]
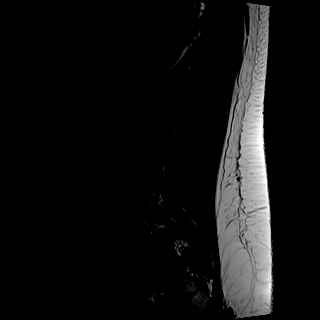
[im 6/16]
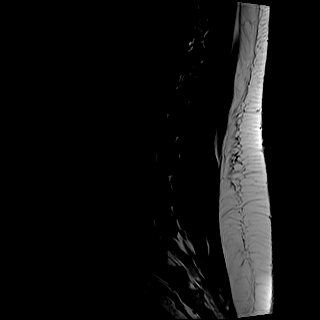
[im 11/16]
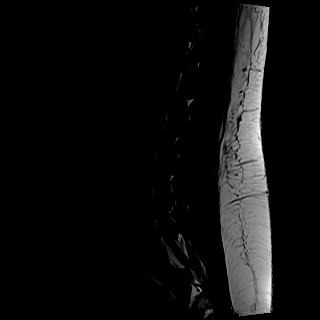
[im 16/16]
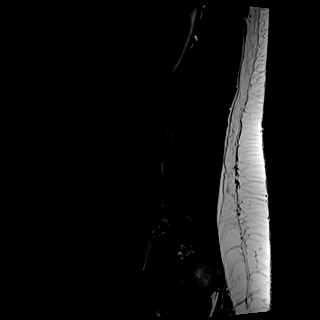

[Series 8: T2 · axial · 4.0mm · 0.57mm/px · z∈[-117,+102]mm · 8 of 37 slices shown (2 of 2)]
[im 1/37]
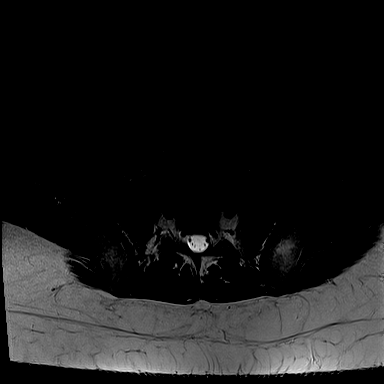
[im 5/37]
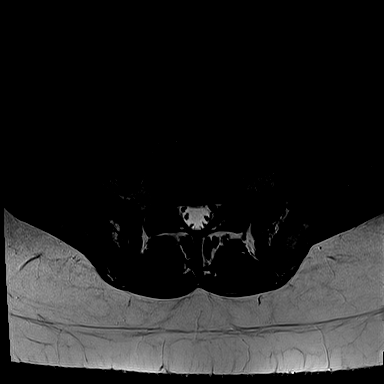
[im 13/37]
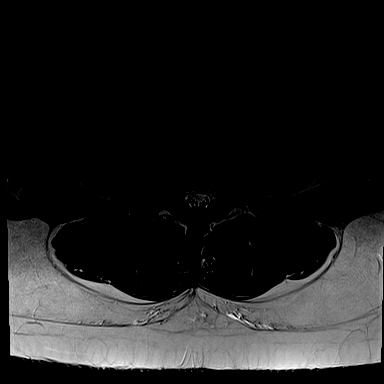
[im 17/37]
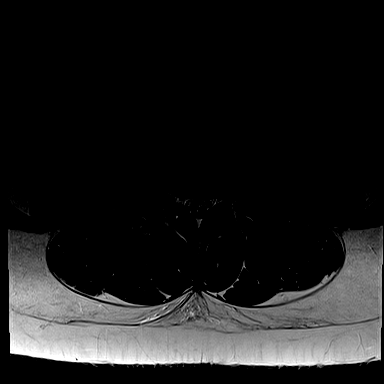
[im 21/37]
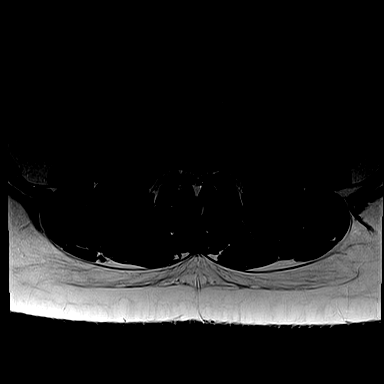
[im 25/37]
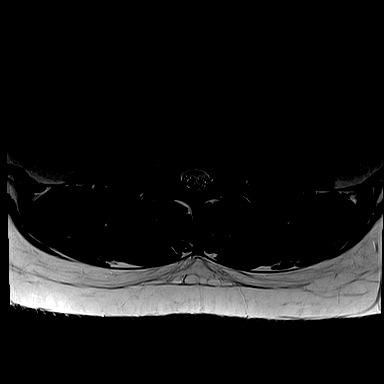
[im 33/37]
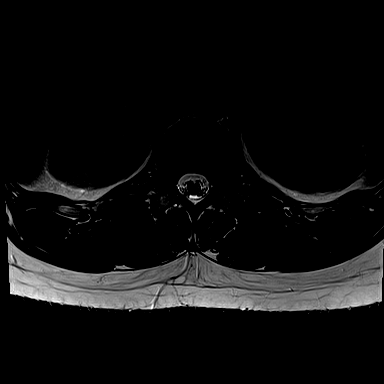
[im 37/37]
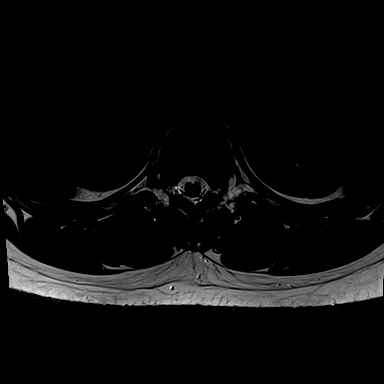

[Series 9: T1 · axial · 4.0mm · 0.34mm/px · z∈[-117,+82]mm · 7 of 37 slices shown (2 of 2)]
[im 1/37]
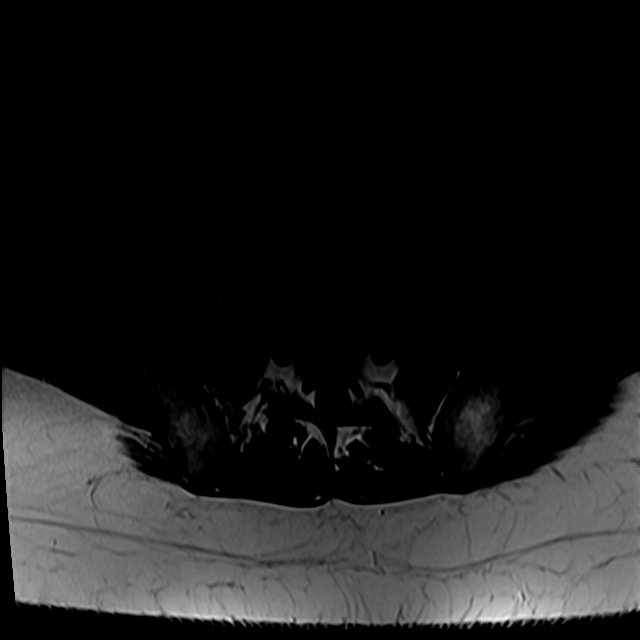
[im 5/37]
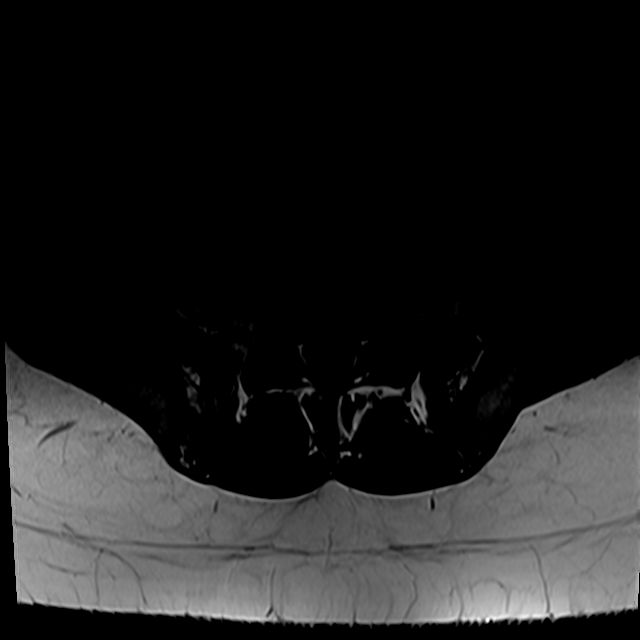
[im 13/37]
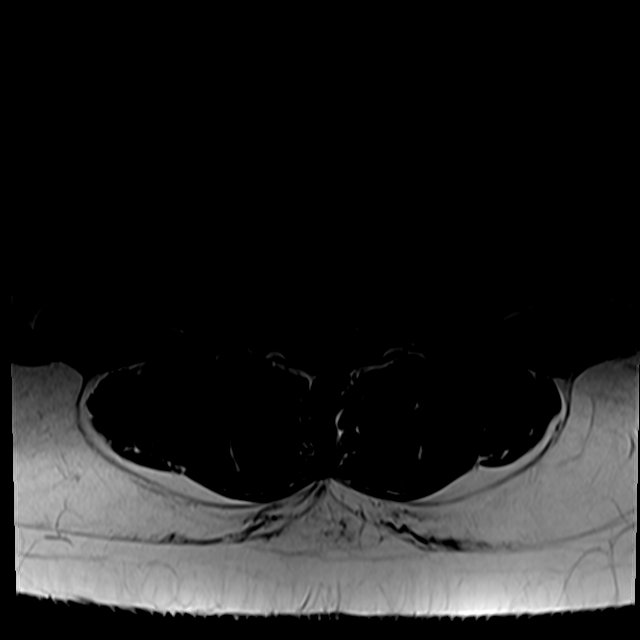
[im 17/37]
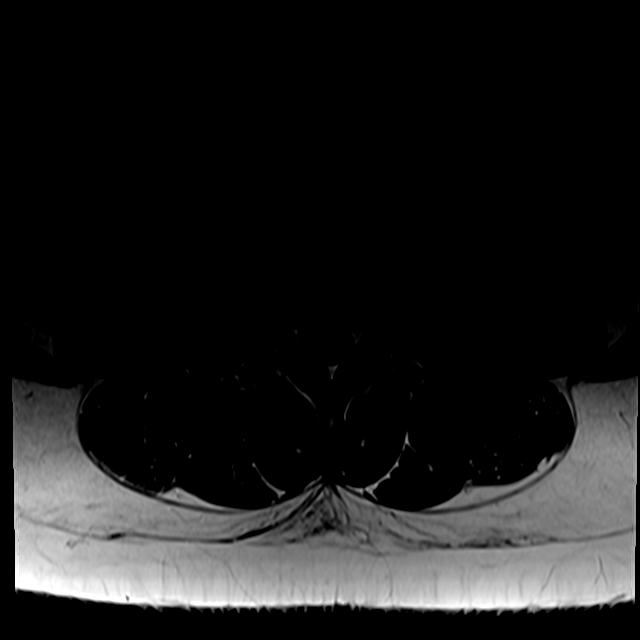
[im 21/37]
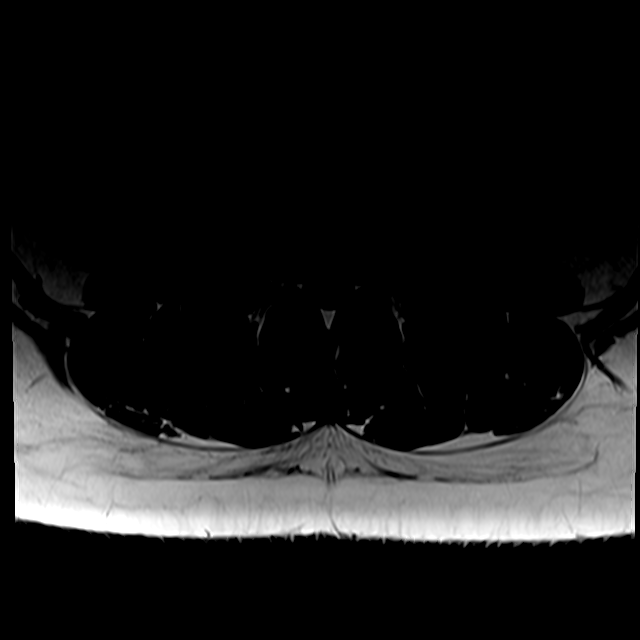
[im 25/37]
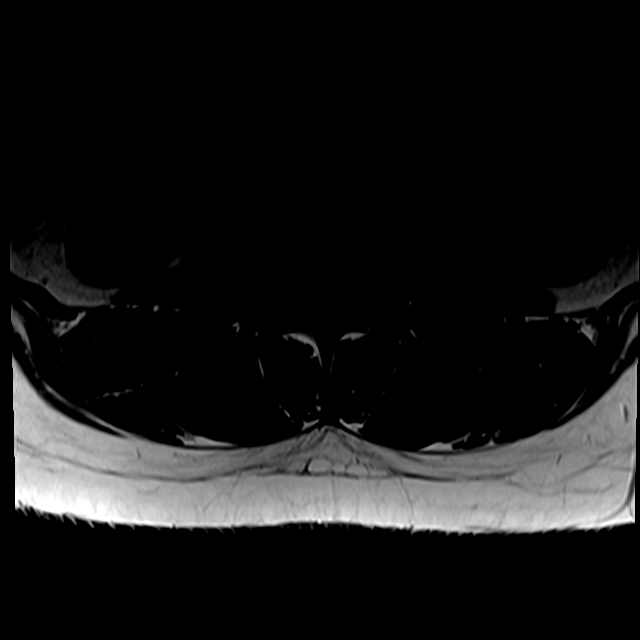
[im 33/37]
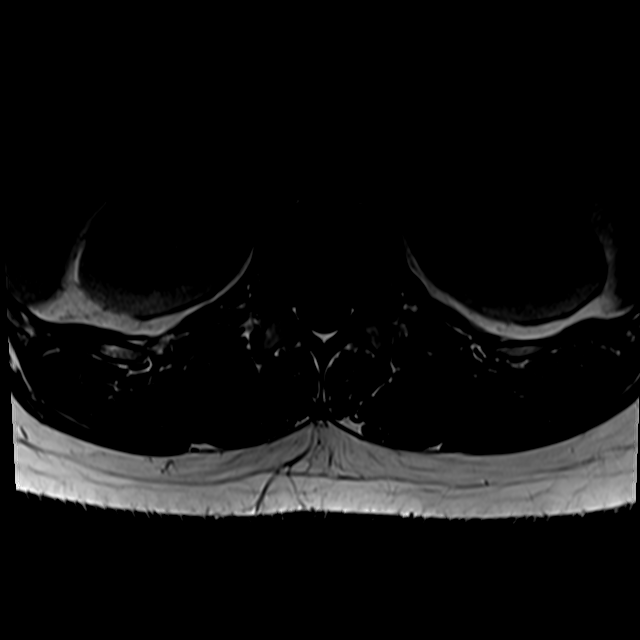

[24 of 48 positions shown; findings below may reference images not displayed]

FINDINGS: Segmentation:  Standard

Alignment:  Physiologic.

Vertebrae:  No fracture, evidence of discitis, or bone lesion.

Conus medullaris and cauda equina: Conus extends to the L1 level.
There is abnormal cord signal within the distal cord and conus
medullaris, at T11, T12, and L1. There is a partially visualized
cord enhancement at the level of T11 (sagittal postcontrast image
9). There are no enhancing cauda quinine nerve roots. No nerve root
clumping.

Paraspinal and other soft tissues: Negative.

Disc levels: There is minimal disc bulging at L3-L4, L4-L5, and
L5-S1. There is no significant spinal canal stenosis or neural
foraminal narrowing in the lumbar spine.
IMPRESSION: Abnormal cord signal within the distal cord and conus medullaris at
T11, T12, and L1 consistent with demyelinating plaques. Partially
visualized enhancing plaque at T11, consistent with active
demyelination.

Normal cauda equina nerve roots.

## 2021-07-06 MED ORDER — ENOXAPARIN SODIUM 40 MG/0.4ML IJ SOSY
40.0000 mg | PREFILLED_SYRINGE | INTRAMUSCULAR | Status: DC
  Administered 2021-07-07 – 2021-07-08 (×2): 40 mg via SUBCUTANEOUS
  Filled 2021-07-06 (×3): qty 0.4

## 2021-07-06 MED ORDER — GADOBUTROL 1 MMOL/ML IV SOLN
10.0000 mL | Freq: Once | INTRAVENOUS | Status: AC | PRN
  Administered 2021-07-06: 10 mL via INTRAVENOUS

## 2021-07-06 NOTE — Progress Notes (Addendum)
Neurology Progress Note  Brief HPI: 25 y.o. male with PMHx of HLD who presented to the ED 12/16 for evaluation of difficulty walking for a few days PTA with right leg weakness and dragging of his right foot with ambulation. Patient states that his gait has been shuffling for 3-4 days PTA with numbness in his feet with right > left leg numbness. He also complains of sensory changes to his abdomen with pins and needles sensation while in the shower and possibly some complaints of saddle anesthesia. Patient did have an event in the past of bilateral hand numbness concerning for a history of undiagnosed symptoms and exacerbation.   Interval history:  12/17: Initially, patient had been treated for possible GBS with one round of IVIG though on repeat examination with hyperreflexia and increased tone of bilateral lower extremities in addition to a sensory level deficit, imaging was obtained revealing concern for demyelinating disease consistent with MS.  12/17: Pulse dose steroids initiated for 5 days, MRI lumbar spine ordered with patient complaints of possible saddle anesthesia.  12/18: Lumbar spine imaging complete  Subjective: Patient endorses normal sensation to bilateral lower extremities and feet as well as to his abdomen.  He does endorse brief left neck stiffness on waking that has since resolved  Exam: Vitals:   07/06/21 0434 07/06/21 0900  BP: 119/75 137/84  Pulse: 91 85  Resp: 16 18  Temp: 98.1 F (36.7 C) (!) 97.4 F (36.3 C)  SpO2: 96% 96%   Gen: Awake, alert, sitting up in bed, in no acute distress Resp: non-labored breathing, no respiratory distress on room air Abd: soft, non-tender, non-distended  Neuro: Mental Status: Awake, alert, and oriented. Thought content appropriate. Speech is intact without dysarthria or aphasia.  Patient follows commands without difficulty.  Cranial Nerves: PERRL, EOMI without ptosis or diplopia, facial sensation is intact and symmetric to light  touch, face is symmetric resting and with movement, hearing is intact to voice, shoulders shrug symmetrically, tongue and palate are midline Motor: Bilateral upper extremities 5/5 strength Right lower extremity has some minimal weakness compared to the left, patient endorses subjective weakness of the right lower extremity with elevation. Left ankle dorsiflexion 4+/5, right 4-/5 Left ankle plantarflexion 4+/5, right 3/5 Sensory: Sensation intact and symmetric to light touch throughout  DTR: Hyperreflexia throughout bilateral lower extremities with 3+ knee jerks and ankle with  Right ankle with 3-4 beats of clonus. Toes equivocal with no upgoing great toe but fanning of other toes. Gait: Deferred  Pertinent Labs: CBC    Component Value Date/Time   WBC 6.1 07/05/2021 0318   RBC 5.03 07/05/2021 0318   HGB 13.7 07/05/2021 0318   HCT 40.6 07/05/2021 0318   PLT 251 07/05/2021 0318   MCV 80.7 07/05/2021 0318   MCH 27.2 07/05/2021 0318   MCHC 33.7 07/05/2021 0318   RDW 13.6 07/05/2021 0318   LYMPHSABS 2.1 07/04/2021 2000   MONOABS 0.5 07/04/2021 2000   EOSABS 0.1 07/04/2021 2000   BASOSABS 0.0 07/04/2021 2000   CMP     Component Value Date/Time   NA 135 07/05/2021 0318   K 3.4 (L) 07/05/2021 0318   CL 102 07/05/2021 0318   CO2 24 07/05/2021 0318   GLUCOSE 91 07/05/2021 0318   BUN 19 07/05/2021 0318   CREATININE 0.97 07/05/2021 0318   CREATININE 0.97 03/20/2016 1115   CALCIUM 8.9 07/05/2021 0318   PROT 7.8 07/05/2021 0318   ALBUMIN 3.7 07/05/2021 0318   AST 14 (L) 07/05/2021 7425  ALT 30 07/05/2021 0318   ALKPHOS 65 07/05/2021 0318   BILITOT 0.8 07/05/2021 0318   GFRNONAA >60 07/05/2021 0318   GFRNONAA >89 03/20/2016 1115   GFRAA >89 03/20/2016 1115   Lab Results  Component Value Date   VITAMINB12 392 07/06/2021   Imaging Reviewed:  MRI lumbar spine wwo 12/18: - Abnormal cord signal within the distal cord and conus medullaris at T11, T12, and L1 consistent with  demyelinating plaques. Partially visualized enhancing plaque at T11, consistent with active demyelination. - Normal cauda equina nerve roots.  MRI brain wwo contrast 12/17: Demyelinating pattern which is extensive and shows signs of active disease. Findings would be consistent with multiple sclerosis.   MR cervical spine wwo contrast 12/17: Diffuse short segment cord signal abnormalities compatible with demyelinating plaques. A plaque in the ventral cord at C6-7 is enhancing.   MR thoracic spine wwo 12/17: Numerous cord signal abnormalities compatible with demyelinating plaques. Enhancing plaques are seen at T2-3, T9, and T11.  Assessment: 25 y.o. male who presented for evaluation of difficulty walking for a few days PTA with right leg weakness and dragging of his right foot with ambulation. Patient states that his gait has been shuffling for 3-4 days PTA with numbness in his feet with right > left leg numbness. He also complains of sensory changes to his abdomen with pins and needles sensation while in the shower and some saddle anesthesia. Patient did have an event in the past of bilateral hand numbness concerning for a history of undiagnosed symptoms and exacerbation.  - Examination reveals patient with hyperreflexia, bilateral ankle weakness right > left, increased tone of bilateral lower extremities, and right ankle clonus 3-4 beats with ankle dorsiflexion.  - MRI imaging as above with multiple T2/FLAIR hyperintensities in the brain with periventricular pattern and extracortical pattern with some enhancing lesions as well as multiple enhancing lesions in cervical and thoracic spine up to T11 level-consistent with active demyelination and due to evidence of dissemination in time and space most likely diagnosis of multiple sclerosis. Patient was initiated on pulse dose steroids on 12/17 to be completed 12/21.  Recommendations: - Follow up on pending labs: ACE, Lyme, B12, Vit D and NMO antibody  as well as anti-MOG antibody - Continue Solu-Medrol 1,000 mg daily for a total of 5 days-last dose 07/09/2021 - PPI while on steroids - Neurology will continue to follow along intermittently. - Will need outpatient follow up with Dr. Epimenio Foot for DMTs   Lanae Boast, AGACNP-BC [facility] [phone]   Attending Neurohospitalist Addendum Patient seen and examined with APP/Resident. Agree with the history and physical as documented above. Agree with the plan as documented, which I helped formulate. I have independently reviewed the chart, obtained history, review of systems and examined the patient.I have personally reviewed pertinent head/neck/spine imaging (CT/MRI). Plan discussed in detail with Dr. Blake Divine as well as the patient. Please feel free to call with any questions.  -- Milon Dikes, MD Neurologist [facility] Pager: [phone]

## 2021-07-06 NOTE — Progress Notes (Signed)
°  Transition of Care [facility]) Screening Note   Patient Details  Name: Willie Archer Date of Birth: Dec 08, 1995   Transition of Care [facility]) CM/SW Contact:    Windle Guard, LCSW Phone Number: 07/06/2021, 12:14 PM    Transition of Care Department [facility]) has reviewed patient and no TOC needs have been identified at this time. We will continue to monitor patient advancement through interdisciplinary progression rounds. If new patient transition needs arise, please place a TOC consult.

## 2021-07-06 NOTE — Progress Notes (Signed)
PROGRESS NOTE    Willie Archer  HDQ:[id number] DOB: [birth year] DOA: 07/04/2021 PCP: Lucky Cowboy, MD   Chief Complaint  Patient presents with   Numbness    Brief Narrative: Willie Archer is a 25 y.o. male with medical history significant for HLD who presents from [facility] ER for evaluation of GBS.  He reports he been having difficulty walking for the last few days.  He states that it feels like his right leg is weak and he has been having to drag his foot.  He states that for the last 3 to 4 days he has had a shuffling gait and he has noticed numbness in his feet.  He states he cannot feel the ground so he just shuffles and that is picking up his feet when he walks.  The numbness is bilaterally but it seems to be more pronounced with weakness in the right leg than the left.  He went to an urgent care and then was referred to the [facility] ER.  He states that 2 weeks ago he had a mild upper respiratory infection with some nasal congestion and a dry cough that resolved on its own.  He did take a flu and COVID test that was negative at that time.  Reports he has never had symptoms like this in the past.     Neurology consulted and stat imaging of the brain with MRI BRAIN and cervical and thoracic spine ordered.  He was found to have Demyelinating pattern which is extensive and shows signs of active disease. Probably secondary to Multiple Sclerosis.  Numerous cord signal abnormalities compatible with demyelinating plaques. Enhancing plaques are seen at T2-3, T9, and T11. Diffuse short segment cord signal abnormalities compatible with demyelinating plaques. A plaque in the ventral cord at C6-7 is enhancing.   He was started on high dose steroids with IV solumedrol 1g for 5 days for Multiple Sclerosis .   Pt seen reports right foot, he has sensation of pins and needles. MRI lumbar spine ordered for   Assessment & Plan:   Active Problems:   Hyperlipidemia   Ascending paralysis  (HCC)   Multiple sclerosis (HCC)   MS: Started on high dose steroids to complete 5 day course. MRI lumbar spine ordered for evaluation of saddle anesthesia.  Appreciate neurology recommendations.     Hyperlipidemia:  Pt was on rosuvastatin, non compliant, will need referral to lipid specialist on discharge.  Will discharge him on rosuvastatin.    DVT prophylaxis: scd's Code Status: full code.  Family Communication: none at bedside.  Disposition:   Status is: Inpatient  Remains inpatient appropriate because:  IV steroids.        Consultants:  Neurology.   Procedures: MRI brain, and cervical, thoracic, and lumbar spine.   Antimicrobials: none.    Subjective: Improving sensation int he right foot.   Objective: Vitals:   07/05/21 2352 07/06/21 0434 07/06/21 0900 07/06/21 1223  BP: 133/78 119/75 137/84 129/74  Pulse: 100 91 85 85  Resp: 20 16 18 16   Temp: 98 F (36.7 C) 98.1 F (36.7 C) (!) 97.4 F (36.3 C) 97.9 F (36.6 C)  TempSrc: Oral Oral Oral Oral  SpO2: 98% 96% 96% 99%  Weight:      Height:        Intake/Output Summary (Last 24 hours) at 07/06/2021 1412 Last data filed at 07/06/2021 0900 Gross per 24 hour  Intake --  Output 1400 ml  Net -1400 ml   [employer]  07/04/21 1920 07/05/21 0147  Weight: 98.4 kg 96.2 kg    Examination:  General exam: Appears calm and comfortable  Respiratory system: Clear to auscultation. Respiratory effort normal. Cardiovascular system: S1 & S2 heard, RRR. No JVD,  No pedal edema. Gastrointestinal system: Abdomen is nondistended, soft and nontender.  Normal bowel sounds heard. Central nervous system: Alert and oriented , paraesthesias, saddle anesthesia present, pins and needles in the right foot.  Extremities: Symmetric 5 x 5 power. Skin: No rashes, lesions or ulcers Psychiatry:  Mood & affect appropriate.     Data Reviewed: I have personally reviewed following labs and imaging studies  CBC: Recent  Labs  Lab 07/04/21 2000 07/05/21 0318  WBC 6.6 6.1  NEUTROABS 3.8  --   HGB 14.6 13.7  HCT 43.4 40.6  MCV 79.2* 80.7  PLT 300 251    Basic Metabolic Panel: Recent Labs  Lab 07/04/21 2000 07/05/21 0318 07/06/21 0735  NA 138 135 134*  K 3.4* 3.4* 4.5  CL 101 102 104  CO2 27 24 20*  GLUCOSE 99 91 146*  BUN 19 19 16   CREATININE 0.99 0.97 0.88  CALCIUM 9.7 8.9 9.4    GFR: Estimated Creatinine Clearance: 141.8 mL/min (by C-G formula based on SCr of 0.88 mg/dL).  Liver Function Tests: Recent Labs  Lab 07/04/21 2000 07/05/21 0318  AST 16 14*  ALT 30 30  ALKPHOS 61 65  BILITOT 0.5 0.8  PROT 7.8 7.8  ALBUMIN 4.7 3.7    CBG: No results for input(s): GLUCAP in the last 168 hours.   Recent Results (from the past 240 hour(s))  Resp Panel by RT-PCR (Flu A&B, Covid) Nasopharyngeal Swab     Status: None   Collection Time: 07/04/21  8:03 PM   Specimen: Nasopharyngeal Swab; Nasopharyngeal(NP) swabs in vial transport medium  Result Value Ref Range Status   SARS Coronavirus 2 by RT PCR NEGATIVE NEGATIVE Final    Comment: (NOTE) SARS-CoV-2 target nucleic acids are NOT DETECTED.  The SARS-CoV-2 RNA is generally detectable in upper respiratory specimens during the acute phase of infection. The lowest concentration of SARS-CoV-2 viral copies this assay can detect is 138 copies/mL. A negative result does not preclude SARS-Cov-2 infection and should not be used as the sole basis for treatment or other patient management decisions. A negative result may occur with  improper specimen collection/handling, submission of specimen other than nasopharyngeal swab, presence of viral mutation(s) within the areas targeted by this assay, and inadequate number of viral copies(<138 copies/mL). A negative result must be combined with clinical observations, patient history, and epidemiological information. The expected result is Negative.  Fact Sheet for Patients:   07/06/21  Fact Sheet for Healthcare Providers:  BloggerCourse.com  This test is no t yet approved or cleared by the [place] FDA and  has been authorized for detection and/or diagnosis of SARS-CoV-2 by FDA under an Emergency Use Authorization (EUA). This EUA will remain  in effect (meaning this test can be used) for the duration of the COVID-19 declaration under Section 564(b)(1) of the Act, 21 U.S.C.section 360bbb-3(b)(1), unless the authorization is terminated  or revoked sooner.       Influenza A by PCR NEGATIVE NEGATIVE Final   Influenza B by PCR NEGATIVE NEGATIVE Final    Comment: (NOTE) The Xpert Xpress SARS-CoV-2/FLU/RSV plus assay is intended as an aid in the diagnosis of influenza from Nasopharyngeal swab specimens and should not be used as a sole basis for treatment. Nasal washings and  aspirates are unacceptable for Xpert Xpress SARS-CoV-2/FLU/RSV testing.  Fact Sheet for Patients: BloggerCourse.com  Fact Sheet for Healthcare Providers: SeriousBroker.it  This test is not yet approved or cleared by the [place] FDA and has been authorized for detection and/or diagnosis of SARS-CoV-2 by FDA under an Emergency Use Authorization (EUA). This EUA will remain in effect (meaning this test can be used) for the duration of the COVID-19 declaration under Section 564(b)(1) of the Act, 21 U.S.C. section 360bbb-3(b)(1), unless the authorization is terminated or revoked.  Performed at [facility], [address] [place], [place] [address]          Radiology Studies: MR BRAIN W WO CONTRAST  Result Date: 07/05/2021 CLINICAL DATA:  Neuro degenerative disorder suspected. Neuro deficit. EXAM: MRI HEAD WITHOUT AND WITH CONTRAST TECHNIQUE: Multiplanar, multiecho pulse sequences of the brain and surrounding structures were obtained without  and with intravenous contrast. CONTRAST:  9.55mL GADAVIST GADOBUTROL 1 MMOL/ML IV SOLN COMPARISON:  None. FINDINGS: Brain: Extensive FLAIR hyperintensity in the cerebral white matter, especially around the lateral ventricles with ovoid radiating pattern and a few areas of nodular plaque-like enhancement. Infratentorial involvement is also present the left deep cerebellum (where there is also enhancement) and ventral right pons. Juxtacortical plaques are also present. Brain volume is normal. Vascular: Normal flow voids and vascular enhancements. Skull and upper cervical spine: Normal marrow signal Sinuses/Orbits: Complete opacification of the left maxillary sinus primarily from submucosal edema or retention cyst by postcontrast imaging. Mucosal thickening also seen in the posterior ethmoid/sphenoid sinuses. IMPRESSION: Demyelinating pattern which is extensive and shows signs of active disease. Findings would be consistent with multiple sclerosis. Electronically Signed   By: Tiburcio Pea M.D.   On: 07/05/2021 11:06   MR CERVICAL SPINE W WO CONTRAST  Result Date: 07/05/2021 CLINICAL DATA:  Acute myelopathy EXAM: MRI CERVICAL SPINE WITHOUT AND WITH CONTRAST TECHNIQUE: Multiplanar and multiecho pulse sequences of the cervical spine, to include the craniocervical junction and cervicothoracic junction, were obtained without and with intravenous contrast. CONTRAST:  9.74mL GADAVIST GADOBUTROL 1 MMOL/ML IV SOLN COMPARISON:  None. FINDINGS: Alignment: Normal Vertebrae: No fracture, evidence of discitis, or bone lesion. Cord: Multiple short segment T2 hyperintensities seen in the ventral cord at: The central cord at C2-3 Dorsal and right lateral cord at C4 Central and right lateral cord at C6 Ventral cord at C6-7 Left-sided cord at C7. Ill-defined enhancement is associated with the ventral cord plaque at C6-7. Posterior Fossa, vertebral arteries, paraspinal tissues: Negative Disc levels: Preserved disc height and  hydration diffusely.  Negative facets. IMPRESSION: Diffuse short segment cord signal abnormalities compatible with demyelinating plaques. A plaque in the ventral cord at C6-7 is enhancing. Electronically Signed   By: Tiburcio Pea M.D.   On: 07/05/2021 11:24   MR THORACIC SPINE W WO CONTRAST  Result Date: 07/05/2021 CLINICAL DATA:  Acute myelopathy EXAM: MRI THORACIC WITHOUT AND WITH CONTRAST TECHNIQUE: Multiplanar and multiecho pulse sequences of the thoracic spine were obtained without and with intravenous contrast. CONTRAST:  9.9mL GADAVIST GADOBUTROL 1 MMOL/ML IV SOLN COMPARISON:  None. FINDINGS: Alignment:  Unremarkable Vertebrae: No fracture, evidence of discitis, or bone lesion. Cord: Multiple short segment cord signal abnormalities in the right cord at T1 Right cord at T2 Central cord at T3-4 Left posterior cord at T8 Hazily in the central cord at T9 Posterior cord at T112 Paraspinal and other soft tissues: Unremarkable Disc levels: Diffusely preserved disc height and hydration. No degenerative impingement IMPRESSION: Numerous cord signal  abnormalities compatible with demyelinating plaques. Enhancing plaques are seen at T2-3, T9, and T11. Electronically Signed   By: Tiburcio Pea M.D.   On: 07/05/2021 11:30   MR LUMBAR SPINE W WO CONTRAST  Result Date: 07/06/2021 CLINICAL DATA:  Demyelinating disease EXAM: MRI LUMBAR SPINE WITHOUT AND WITH CONTRAST TECHNIQUE: Multiplanar and multiecho pulse sequences of the lumbar spine were obtained without and with intravenous contrast. CONTRAST:  83mL GADAVIST GADOBUTROL 1 MMOL/ML IV SOLN COMPARISON:  None. FINDINGS: Segmentation:  Standard Alignment:  Physiologic. Vertebrae:  No fracture, evidence of discitis, or bone lesion. Conus medullaris and cauda equina: Conus extends to the L1 level. There is abnormal cord signal within the distal cord and conus medullaris, at T11, T12, and L1. There is a partially visualized cord enhancement at the level of T11  (sagittal postcontrast image 9). There are no enhancing cauda quinine nerve roots. No nerve root clumping. Paraspinal and other soft tissues: Negative. Disc levels: There is minimal disc bulging at L3-L4, L4-L5, and L5-S1. There is no significant spinal canal stenosis or neural foraminal narrowing in the lumbar spine. IMPRESSION: Abnormal cord signal within the distal cord and conus medullaris at T11, T12, and L1 consistent with demyelinating plaques. Partially visualized enhancing plaque at T11, consistent with active demyelination. Normal cauda equina nerve roots. Electronically Signed   By: Caprice Renshaw M.D.   On: 07/06/2021 13:45        Scheduled Meds:  pantoprazole (PROTONIX) IV  40 mg Intravenous Q24H   rosuvastatin  40 mg Oral Daily   sodium chloride flush  3 mL Intravenous Q12H   Continuous Infusions:  sodium chloride     methylPREDNISolone (SOLU-MEDROL) injection 1,000 mg (07/06/21 1326)     LOS: 1 day        Kathlen Mody, MD [facility]   To contact the attending provider between 7A-7P or the covering provider during after hours 7P-7A, please log into the web site [email or web address] and access using universal [place] password for that web site. If you do not have the password, please call the hospital operator.  07/06/2021, 2:12 PM

## 2021-07-07 LAB — CBC
HCT: 41.4 % (ref 39.0–52.0)
Hemoglobin: 14.1 g/dL (ref 13.0–17.0)
MCH: 27.3 pg (ref 26.0–34.0)
MCHC: 34.1 g/dL (ref 30.0–36.0)
MCV: 80.1 fL (ref 80.0–100.0)
Platelets: 293 10*3/uL (ref 150–400)
RBC: 5.17 MIL/uL (ref 4.22–5.81)
RDW: 13.3 % (ref 11.5–15.5)
WBC: 11.9 10*3/uL — ABNORMAL HIGH (ref 4.0–10.5)
nRBC: 0 % (ref 0.0–0.2)

## 2021-07-07 LAB — BASIC METABOLIC PANEL
Anion gap: 6 (ref 5–15)
BUN: 22 mg/dL — ABNORMAL HIGH (ref 6–20)
CO2: 24 mmol/L (ref 22–32)
Calcium: 9.1 mg/dL (ref 8.9–10.3)
Chloride: 104 mmol/L (ref 98–111)
Creatinine, Ser: 0.94 mg/dL (ref 0.61–1.24)
GFR, Estimated: >60 mL/min (ref >60)
Glucose, Bld: 143 mg/dL — ABNORMAL HIGH (ref 70–99)
Potassium: 4.4 mmol/L (ref 3.5–5.1)
Sodium: 134 mmol/L — ABNORMAL LOW (ref 135–145)

## 2021-07-07 LAB — VITAMIN D 25 HYDROXY (VIT D DEFICIENCY, FRACTURES): Vit D, 25-Hydroxy: 10.06 ng/mL — ABNORMAL LOW (ref 30–100)

## 2021-07-07 LAB — NEUROMYELITIS OPTICA AUTOAB, IGG: NMO-IgG: <1.5 U/mL (ref 0.0–3.0)

## 2021-07-07 LAB — ANGIOTENSIN CONVERTING ENZYME: Angiotensin-Converting Enzyme: 35 U/L (ref 14–82)

## 2021-07-07 LAB — LYME DISEASE SEROLOGY W/REFLEX: Lyme Total Antibody EIA: NEGATIVE

## 2021-07-07 MED ORDER — SENNOSIDES-DOCUSATE SODIUM 8.6-50 MG PO TABS
2.0000 | ORAL_TABLET | Freq: Two times a day (BID) | ORAL | Status: DC
  Administered 2021-07-07 – 2021-07-09 (×4): 2 via ORAL
  Filled 2021-07-07 (×4): qty 2

## 2021-07-07 MED ORDER — POLYETHYLENE GLYCOL 3350 17 G PO PACK
17.0000 g | PACK | Freq: Every day | ORAL | Status: DC
  Administered 2021-07-07 – 2021-07-09 (×3): 17 g via ORAL
  Filled 2021-07-07 (×3): qty 1

## 2021-07-07 MED ORDER — VITAMIN D (ERGOCALCIFEROL) 1.25 MG (50000 UNIT) PO CAPS
50000.0000 [IU] | ORAL_CAPSULE | ORAL | Status: DC
  Administered 2021-07-07: 15:00:00 50000 [IU] via ORAL
  Filled 2021-07-07: qty 1

## 2021-07-07 NOTE — Progress Notes (Signed)
PROGRESS NOTE    Willie Archer  NIO:[id number] DOB: November 09, 1995 DOA: 07/04/2021 PCP: Lucky Cowboy, MD   Chief Complaint  Patient presents with   Numbness    Brief Narrative: Willie Archer is a 25 y.o. male with medical history significant for HLD who presents from [facility] ER for evaluation of GBS.  He reports he been having difficulty walking for the last few days.  He states that it feels like his right leg is weak and he has been having to drag his foot.  He states that for the last 3 to 4 days he has had a shuffling gait and he has noticed numbness in his feet.  He states he cannot feel the ground so he just shuffles and that is picking up his feet when he walks.  The numbness is bilaterally but it seems to be more pronounced with weakness in the right leg than the left.  He went to an urgent care and then was referred to the [facility] ER.  He states that 2 weeks ago he had a mild upper respiratory infection with some nasal congestion and a dry cough that resolved on its own.  He did take a flu and COVID test that was negative at that time.  Reports he has never had symptoms like this in the past.     Neurology consulted and stat imaging of the brain with MRI BRAIN and cervical and thoracic spine ordered.  He was found to have Demyelinating pattern which is extensive and shows signs of active disease. Probably secondary to Multiple Sclerosis.  Numerous cord signal abnormalities compatible with demyelinating plaques. Enhancing plaques are seen at T2-3, T9, and T11. Diffuse short segment cord signal abnormalities compatible with demyelinating plaques. A plaque in the ventral cord at C6-7 is enhancing.   He was started on high dose steroids with IV solumedrol 1g for 5 days for Multiple Sclerosis .  Pt seen and examined at bedside. Therapy evaluations ordered.   Assessment & Plan:   Active Problems:   Hyperlipidemia   Ascending paralysis (HCC)   Multiple sclerosis  (HCC)   MS: Started on high dose steroids to complete 5 day course..   Further work up with ACE, Lyme, B12, Vit D and NMO antibody as well as anti-MOG antibody ordered.  Vitamin D level is 10, negative lyme antibody, respiratory panel is negative.  ACE levels negative. HIV screen is negative.  Pt reports he is able to urinate without any issues. No BM so far. Will add stool softeners.  Appreciate neurology recommendations. Will need to follow up with Dr Epimenio Foot outpatient.  Therapy evaluations ordered and pending.     Hyperlipidemia:  Pt was on rosuvastatin, non compliant, will need referral to lipid specialist on discharge.  Will discharge him on rosuvastatin.    Mild hyponatremia.    Vitamin D deficiency:  Supplementation added.   Hypokalemia Replaced.    DVT prophylaxis: Lovenox.  Code Status: full code.  Family Communication: Dad at bedside.  Disposition:   Status is: Inpatient  Remains inpatient appropriate because:  IV steroids.        Consultants:  Neurology.   Procedures: MRI brain, and cervical, thoracic, and lumbar spine.   Antimicrobials: none.    Subjective: Able to wiggle his toes on the right.   Objective: Vitals:   07/06/21 1955 07/06/21 2356 07/07/21 0319 07/07/21 0700  BP: 135/77 132/75 119/75 (P) 137/78  Pulse: 93 90 83   Resp: 18 18 16  Temp: 98 F (36.7 C) 98.1 F (36.7 C) 97.8 F (36.6 C) (P) 98.2 F (36.8 C)  TempSrc: Oral Oral Oral (P) Oral  SpO2: 97% 97% 97% (P) 99%  Weight:      Height:        Intake/Output Summary (Last 24 hours) at 07/07/2021 1252 Last data filed at 07/07/2021 0353 Gross per 24 hour  Intake 133.19 ml  Output 550 ml  Net -416.81 ml    Filed Weights   07/04/21 1920 07/05/21 0147  Weight: 98.4 kg 96.2 kg    Examination:  General exam: Appears calm and comfortable  Respiratory system: Clear to auscultation. Respiratory effort normal. Cardiovascular system: S1 & S2 heard, RRR. No JVD,  No  pedal edema. Gastrointestinal system: Abdomen is nondistended, soft and nontender.  Normal bowel sounds heard. Central nervous system: Alert and oriented. Paraesthesia on the lower extremities and upper extremities.    Extremities: no pedal edema.  Skin: No rashes, lesions or ulcers Psychiatry:  Mood & affect appropriate.      Data Reviewed: I have personally reviewed following labs and imaging studies  CBC: Recent Labs  Lab 07/04/21 2000 07/05/21 0318 07/07/21 0403  WBC 6.6 6.1 11.9*  NEUTROABS 3.8  --   --   HGB 14.6 13.7 14.1  HCT 43.4 40.6 41.4  MCV 79.2* 80.7 80.1  PLT 300 251 293     Basic Metabolic Panel: Recent Labs  Lab 07/04/21 2000 07/05/21 0318 07/06/21 0735 07/07/21 0403  NA 138 135 134* 134*  K 3.4* 3.4* 4.5 4.4  CL 101 102 104 104  CO2 27 24 20* 24  GLUCOSE 99 91 146* 143*  BUN 19 19 16  22*  CREATININE 0.99 0.97 0.88 0.94  CALCIUM 9.7 8.9 9.4 9.1     GFR: Estimated Creatinine Clearance: 132.7 mL/min (by C-G formula based on SCr of 0.94 mg/dL).  Liver Function Tests: Recent Labs  Lab 07/04/21 2000 07/05/21 0318  AST 16 14*  ALT 30 30  ALKPHOS 61 65  BILITOT 0.5 0.8  PROT 7.8 7.8  ALBUMIN 4.7 3.7     CBG: No results for input(s): GLUCAP in the last 168 hours.   Recent Results (from the past 240 hour(s))  Resp Panel by RT-PCR (Flu A&B, Covid) Nasopharyngeal Swab     Status: None   Collection Time: 07/04/21  8:03 PM   Specimen: Nasopharyngeal Swab; Nasopharyngeal(NP) swabs in vial transport medium  Result Value Ref Range Status   SARS Coronavirus 2 by RT PCR NEGATIVE NEGATIVE Final    Comment: (NOTE) SARS-CoV-2 target nucleic acids are NOT DETECTED.  The SARS-CoV-2 RNA is generally detectable in upper respiratory specimens during the acute phase of infection. The lowest concentration of SARS-CoV-2 viral copies this assay can detect is 138 copies/mL. A negative result does not preclude SARS-Cov-2 infection and should not be used  as the sole basis for treatment or other patient management decisions. A negative result may occur with  improper specimen collection/handling, submission of specimen other than nasopharyngeal swab, presence of viral mutation(s) within the areas targeted by this assay, and inadequate number of viral copies(<138 copies/mL). A negative result must be combined with clinical observations, patient history, and epidemiological information. The expected result is Negative.  Fact Sheet for Patients:  07/06/21  Fact Sheet for Healthcare Providers:  BloggerCourse.com  This test is no t yet approved or cleared by the [place] FDA and  has been authorized for detection and/or diagnosis of SARS-CoV-2 by FDA  under an Emergency Use Authorization (EUA). This EUA will remain  in effect (meaning this test can be used) for the duration of the COVID-19 declaration under Section 564(b)(1) of the Act, 21 U.S.C.section 360bbb-3(b)(1), unless the authorization is terminated  or revoked sooner.       Influenza A by PCR NEGATIVE NEGATIVE Final   Influenza B by PCR NEGATIVE NEGATIVE Final    Comment: (NOTE) The Xpert Xpress SARS-CoV-2/FLU/RSV plus assay is intended as an aid in the diagnosis of influenza from Nasopharyngeal swab specimens and should not be used as a sole basis for treatment. Nasal washings and aspirates are unacceptable for Xpert Xpress SARS-CoV-2/FLU/RSV testing.  Fact Sheet for Patients: BloggerCourse.com  Fact Sheet for Healthcare Providers: SeriousBroker.it  This test is not yet approved or cleared by the [place] FDA and has been authorized for detection and/or diagnosis of SARS-CoV-2 by FDA under an Emergency Use Authorization (EUA). This EUA will remain in effect (meaning this test can be used) for the duration of the COVID-19 declaration under Section 564(b)(1)  of the Act, 21 U.S.C. section 360bbb-3(b)(1), unless the authorization is terminated or revoked.  Performed at [facility], [address] [place], [place] [address]           Radiology Studies: MR LUMBAR SPINE W WO CONTRAST  Result Date: 07/06/2021 CLINICAL DATA:  Demyelinating disease EXAM: MRI LUMBAR SPINE WITHOUT AND WITH CONTRAST TECHNIQUE: Multiplanar and multiecho pulse sequences of the lumbar spine were obtained without and with intravenous contrast. CONTRAST:  70mL GADAVIST GADOBUTROL 1 MMOL/ML IV SOLN COMPARISON:  None. FINDINGS: Segmentation:  Standard Alignment:  Physiologic. Vertebrae:  No fracture, evidence of discitis, or bone lesion. Conus medullaris and cauda equina: Conus extends to the L1 level. There is abnormal cord signal within the distal cord and conus medullaris, at T11, T12, and L1. There is a partially visualized cord enhancement at the level of T11 (sagittal postcontrast image 9). There are no enhancing cauda quinine nerve roots. No nerve root clumping. Paraspinal and other soft tissues: Negative. Disc levels: There is minimal disc bulging at L3-L4, L4-L5, and L5-S1. There is no significant spinal canal stenosis or neural foraminal narrowing in the lumbar spine. IMPRESSION: Abnormal cord signal within the distal cord and conus medullaris at T11, T12, and L1 consistent with demyelinating plaques. Partially visualized enhancing plaque at T11, consistent with active demyelination. Normal cauda equina nerve roots. Electronically Signed   By: Caprice Renshaw M.D.   On: 07/06/2021 13:45        Scheduled Meds:  enoxaparin (LOVENOX) injection  40 mg Subcutaneous Q24H   pantoprazole (PROTONIX) IV  40 mg Intravenous Q24H   rosuvastatin  40 mg Oral Daily   sodium chloride flush  3 mL Intravenous Q12H   Continuous Infusions:  sodium chloride     methylPREDNISolone (SOLU-MEDROL) injection 1,000 mg (07/06/21 1326)     LOS: 2 days        Kathlen Mody, MD [facility]   To contact the attending provider between 7A-7P or the covering provider during after hours 7P-7A, please log into the web site [email or web address] and access using universal [place] password for that web site. If you do not have the password, please call the hospital operator.  07/07/2021, 12:52 PM

## 2021-07-07 NOTE — Progress Notes (Signed)
Subjective: Feels that his symptoms may be slightly better.  I discussed with him, it sounds like he had a previous clinical episode in May with bilateral upper extremity and truncal numbness that gradually improved over about 4 weeks.  Exam: Vitals:   07/07/21 0319 07/07/21 0700  BP: 119/75 137/78  Pulse: 83   Resp: 16   Temp: 97.8 F (36.6 C) 98.2 F (36.8 C)  SpO2: 97% 99%   Gen: In bed, NAD Resp: non-labored breathing, no acute distress Abd: soft, nt  Neuro: MS: Awake, alert, interactive and appropriate CN: Pupils equal and reactive, visual fields full Motor: 5/5 in upper extremities, he has mild dorsiflexion weakness on the right.  Sensory: Intact to light touch DTR: 3+ bilaterally in the legs, 2+ in the upper extremities, no clonus on exam today.  Pertinent Labs: Glucose is okay  Impression: 25 year old male with a second clinical episode and imaging confirming separation in time and space consistent with multiple sclerosis.  Especially given his previous episode, my suspicion is that this does represent MS. initially Guillain-Barr was suspected and therefore he got a single dose of IVIG, thus anti-Mogg antibodies will be difficult to interpret.  Recommendations: 1) continue Solu-Medrol for total 5 days 2) PT/OT  Ritta Slot, MD [facility] [phone]  If 7pm- 7am, please page neurology on call as listed in AMION.

## 2021-07-07 NOTE — Evaluation (Signed)
Physical Therapy Evaluation Patient Details Name: Willie Archer MRN: [id number] DOB: Feb 20, 1996 Today's Date: 07/07/2021  History of Present Illness  Pt is 25 yo male who presents with BLE weakness R>L, numbness in feet,  and difficulty walking. MRI showed significant demyelination in the brain and lumbar regions. Pt with new dx of MS. PMH: HLD, B achilles surgery as a child for toe walking  Clinical Impression  Pt admitted with above diagnosis. Pt reports improvement since initiation of meds, continues to have LE weakness R>L and most notable at ankle with gait. Pt does have a history of Achilles surgery bilaterally as a child but denies any foot drop or low step clearance PTA. Recommend outpt neuro PT at d/c. No recommendations for assistive device or modifications at this point, dorsiflexion will hopefully continue to improve with treatment.  Pt currently with functional limitations due to the deficits listed below (see PT Problem List). Pt will benefit from skilled PT to increase their independence and safety with mobility to allow discharge to the venue listed below.          Recommendations for follow up therapy are one component of a multi-disciplinary discharge planning process, led by the attending physician.  Recommendations may be updated based on patient status, additional functional criteria and insurance authorization.  Follow Up Recommendations Outpatient PT    Assistance Recommended at Discharge Intermittent Supervision/Assistance  Functional Status Assessment    Equipment Recommendations  None recommended by PT    Recommendations for Other Services OT consult     Precautions / Restrictions Precautions Precautions: Fall Restrictions Weight Bearing Restrictions: No      Mobility  Bed Mobility Overal bed mobility: Modified Independent                  Transfers Overall transfer level: Needs assistance Equipment used: None Transfers: Sit to/from  Stand Sit to Stand: Supervision           General transfer comment: min-guard for safety, pt with high guard arms initially but no LOB and no physical assist needed    Ambulation/Gait Ambulation/Gait assistance: Min assist;Min guard Gait Distance (Feet): 150 Feet Assistive device: None;1 person hand held assist Gait Pattern/deviations: Decreased dorsiflexion - right;Decreased dorsiflexion - left Gait velocity: decreased Gait velocity interpretation: 1.31 - 2.62 ft/sec, indicative of limited community ambulator   General Gait Details: began with HHA but quickly progressed to min-guard without support. Pt with decreased df bilaterally and sliding feet on floor at times. Practiced different strategies to increase step height so he doesn't catch toe on obstacles. Noted to have minimal arm swing  Stairs            Wheelchair Mobility    Modified Rankin (Stroke Patients Only)       Balance Overall balance assessment: Mild deficits observed, not formally tested                                           Pertinent Vitals/Pain Pain Assessment: Faces Faces Pain Scale: Hurts a little bit Pain Location: neck pain Pain Descriptors / Indicators: Aching;Sore Pain Intervention(s): Monitored during session    Home Living Family/patient expects to be discharged to:: Private residence Living Arrangements: Parent Available Help at Discharge: Available 24 hours/day Type of Home: House Home Access: Stairs to enter Entrance Stairs-Rails: Right Entrance Stairs-Number of Steps: 1   Home Layout: One level  Home Equipment: None Additional Comments: 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)    Prior Function Prior Level of Function : Independent/Modified Independent             Mobility Comments: pt reports he had increased sway in his gait since childhood when he had B Achilles surgery but no foot drop the like       Hand Dominance   Dominant Hand: Right     Extremity/Trunk Assessment   Upper Extremity Assessment Upper Extremity Assessment: Defer to OT evaluation    Lower Extremity Assessment Lower Extremity Assessment: RLE deficits/detail;LLE deficits/detail;Generalized weakness RLE Deficits / Details: hip flex 3/5, knee ext 3+/5, knee flex 4-/5, ankle df 2-/5 RLE Sensation: decreased light touch;decreased proprioception RLE Coordination: decreased fine motor;decreased gross motor LLE Deficits / Details: hip flex 3+/5, knee ext 4-/5, knee flex 4/5, ankle df 2-/5 LLE Sensation: decreased light touch;decreased proprioception LLE Coordination: decreased fine motor;decreased gross motor    Cervical / Trunk Assessment Cervical / Trunk Assessment: Normal  Communication   Communication: No difficulties  Cognition Arousal/Alertness: Awake/alert Behavior During Therapy: WFL for tasks assessed/performed Overall Cognitive Status: Within Functional Limits for tasks assessed                                          General Comments General comments (skin integrity, edema, etc.): pt able to shift weight in standing and reach >12" without LOB. Education given on MS and energy conservation and importance of adhering to medicines    Exercises     Assessment/Plan    PT Assessment Patient needs continued PT services  PT Problem List Decreased strength;Decreased range of motion;Decreased activity tolerance;Decreased balance;Decreased mobility;Decreased coordination;Pain;Impaired sensation;Decreased knowledge of precautions       PT Treatment Interventions Gait training;Stair training;Functional mobility training;Therapeutic activities;Therapeutic exercise;Balance training;Neuromuscular re-education;Patient/family education    PT Goals (Current goals can be found in the Care Plan section)  Acute Rehab PT Goals Patient Stated Goal: return to independence PT Goal Formulation: With patient Time For Goal Achievement:  07/21/21 Potential to Achieve Goals: Good    Frequency     Barriers to discharge        Co-evaluation               AM-PAC PT "6 Clicks" Mobility  Outcome Measure Help needed turning from your back to your side while in a flat bed without using bedrails?: None Help needed moving from lying on your back to sitting on the side of a flat bed without using bedrails?: None Help needed moving to and from a bed to a chair (including a wheelchair)?: None Help needed standing up from a chair using your arms (e.g., wheelchair or bedside chair)?: None Help needed to walk in hospital room?: A Little Help needed climbing 3-5 steps with a railing? : A Little 6 Click Score: 22    End of Session   Activity Tolerance: Patient tolerated treatment well Patient left: in chair;with call bell/phone within reach Nurse Communication: Mobility status PT Visit Diagnosis: Unsteadiness on feet (R26.81);Muscle weakness (generalized) (M62.81);Difficulty in walking, not elsewhere classified (R26.2);Pain Pain - part of body:  (neck)    Time: 2130-8657 PT Time Calculation (min) (ACUTE ONLY): 44 min   Charges:   PT Evaluation $PT Eval Moderate Complexity: 1 Mod PT Treatments $Gait Training: 8-22 mins $Self Care/Home Management: 8-22  Lyanne Co, PT  Acute Rehab Services  Pager [phone] Office [phone]   Lawana Chambers Ahmaad Neidhardt 07/07/2021, 2:55 PM

## 2021-07-08 NOTE — Progress Notes (Signed)
Subjective: Strength is improving.   Exam: Vitals:   07/07/21 2321 07/08/21 0355  BP: 133/88 (!) 143/90  Pulse: 90 75  Resp: 18 18  Temp: 98.1 F (36.7 C) 97.9 F (36.6 C)  SpO2: 96% 97%   Gen: In bed, NAD Resp: non-labored breathing, no acute distress Abd: soft, nt  Neuro: MS: Awake, alert, interactive and appropriate CN: Pupils equal and reactive, visual fields full Motor: 5/5 in upper extremities, he has mild dorsiflexion weakness on the right.  Sensory: Intact to light touch DTR: 3+ bilaterally in the legs, 2+ in the upper extremities, no clonus on exam today.  Pertinent Labs: Glucose is okay  Impression: 25 year old male with a second clinical episode and imaging confirming separation in time and space consistent with multiple sclerosis.  Especially given his previous episode, my suspicion is that this does represent MS. initially Guillain-Barr was suspected and therefore he got a single dose of IVIG, thus anti-Mog antibodies will be difficult to interpret.  PT recommending outpatient therapy.   Recommendations: 1) continue Solu-Medrol for total 5 days, this could be finished tomorrow as outpatient if it could be arranged.  2) PT/OT  Ritta Slot, MD [facility] [phone]  If 7pm- 7am, please page neurology on call as listed in AMION.

## 2021-07-08 NOTE — Evaluation (Signed)
Occupational Therapy Evaluation and Discharge Patient Details Name: Willie Archer MRN: [id number] DOB: 04-21-1996 Today's Date: 07/08/2021   History of Present Illness Pt is 25 yo male who presents with BLE weakness R>L, numbness in feet,  and difficulty walking. MRI showed significant demyelination in the brain and lumbar regions. Pt with new dx of MS. PMH: HLD, B achilles surgery as a child for toe walking   Clinical Impression   This 25 yo male admitted with above presents to acute OT at a independent level for basic ADLs. No further OT needs, we will sign off.      Recommendations for follow up therapy are one component of a multi-disciplinary discharge planning process, led by the attending physician.  Recommendations may be updated based on patient status, additional functional criteria and insurance authorization.   Follow Up Recommendations  No OT follow up    Assistance Recommended at Discharge None  Functional Status Assessment  Patient has had a recent decline in their functional status and demonstrates the ability to make significant improvements in function in a reasonable and predictable amount of time.  Equipment Recommendations  None recommended by OT       Precautions / Restrictions Precautions Precautions: None Restrictions Weight Bearing Restrictions: No      Mobility Bed Mobility Overal bed mobility: Independent                  Transfers Overall transfer level: Independent Equipment used: None Transfers: Sit to/from [employer] transfer comment: ambulated around room without any issues and no AD      Balance Overall balance assessment: Mild deficits observed, not formally tested                                         ADL either performed or assessed with clinical judgement   ADL Overall ADL's : Independent                                             Vision Baseline  Vision/History: 1 Wears glasses Ability to See in Adequate Light: 0 Adequate Patient Visual Report: No change from baseline              Pertinent Vitals/Pain Pain Assessment: No/denies pain     Hand Dominance Right   Extremity/Trunk Assessment Upper Extremity Assessment Upper Extremity Assessment: Overall WFL for tasks assessed           Communication Communication Communication: No difficulties   Cognition Arousal/Alertness: Awake/alert Behavior During Therapy: WFL for tasks assessed/performed Overall Cognitive Status: Within Functional Limits for tasks assessed                                       General Comments  Pt reports he feels about 80% back to his baseline            Home Living Family/patient expects to be discharged to:: Private residence Living Arrangements: Parent Available Help at Discharge: Available 24 hours/day Type of Home: House Home Access: Stairs to enter [employer] of Steps: 1 Entrance Stairs-Rails: Right Home Layout: One level  Bathroom Shower/Tub: Dispensing optician: None   Additional 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, water)      Prior Functioning/Environment Prior Level of Function : Independent/Modified Independent             Mobility Comments: pt reports he had increased sway in his gait since childhood when he had B Achilles surgery but no foot drop          OT Problem List: Impaired balance (sitting and/or standing) (very mild)         OT Goals(Current goals can be found in the care plan section) Acute Rehab OT Goals Patient Stated Goal: to go home, recover and then back to work                AM-PAC OT "6 Clicks" Daily Activity     Outcome Measure Help from another person eating meals?: None Help from another person taking care of personal grooming?: None Help from another person toileting, which  includes using toliet, bedpan, or urinal?: None Help from another person bathing (including washing, rinsing, drying)?: None Help from another person to put on and taking off regular upper body clothing?: None Help from another person to put on and taking off regular lower body clothing?: None 6 Click Score: 24   End of Session Equipment Utilized During Treatment: Gait belt  Activity Tolerance: Patient tolerated treatment well Patient left:  (up in bathroom washing up and changing clothes)  OT Visit Diagnosis: Other abnormalities of gait and mobility (R26.89)                Time: 0823-0900 OT Time Calculation (min): 37 min Charges:  OT General Charges $OT Visit: 1 Visit OT Evaluation $OT Eval Moderate Complexity: 1 Mod OT Treatments $Self Care/Home Management : 8-22 mins Ignacia Palma, OTR/L Acute [employer] Pager [phone] Office [phone]    Evette Georges 07/08/2021, 11:29 AM

## 2021-07-08 NOTE — Progress Notes (Signed)
PROGRESS NOTE    Willie Archer  WTU:[id number] DOB: 04-Aug-1995 DOA: 07/04/2021 PCP: Willie Cowboy, MD   Chief Complaint  Patient presents with   Numbness    Brief Narrative: Willie Archer is a 25 y.o. male with medical history significant for HLD who presents from [facility] ER for evaluation of GBS.  He reports he been having difficulty walking for the last few days.  He states that it feels like his right leg is weak and he has been having to drag his Archer.  He states that for the last 3 to 4 days he has had a shuffling gait and he has noticed numbness in his feet.  He states he cannot feel the ground so he just shuffles and that is picking up his feet when he walks.  The numbness is bilaterally but it seems to be more pronounced with weakness in the right leg than the left.  He went to an urgent care and then was referred to the [facility] ER.  He states that 2 weeks ago he had a mild upper respiratory infection with some nasal congestion and a dry cough that resolved on its own.  He did take a flu and COVID test that was negative at that time.  Reports he has never had symptoms like this in the past.     Neurology consulted and stat imaging of the brain with MRI BRAIN and cervical and thoracic spine ordered.  He was found to have Demyelinating pattern which is extensive and shows signs of active disease. Probably secondary to Multiple Sclerosis.  Numerous cord signal abnormalities compatible with demyelinating plaques. Enhancing plaques are seen at T2-3, T9, and T11. Diffuse short segment cord signal abnormalities compatible with demyelinating plaques. A plaque in the ventral cord at C6-7 is enhancing.   He was started on high dose steroids with IV solumedrol 1g for 5 days for Multiple Sclerosis .  Pt seen and examined at bedside. Therapy evaluations ordered. Plan for outpatient PT.  Assessment & Plan:   Active Problems:   Hyperlipidemia   Ascending paralysis (HCC)   Multiple  sclerosis (HCC)   MS: Started on high dose steroids to complete 5 day course.Marland Kitchen LAST DOSE tomorrow.   Further work up with ACE, Lyme, B12, Vit D and NMO antibody as well as anti-MOG antibody ordered.  Vitamin D level is 10, negative lyme antibody, respiratory panel is negative.  ACE levels negative. HIV screen is negative.  Pt reports he is able to urinate without any issues. No BM so far. Will add stool softeners.  Appreciate neurology recommendations. Will need to follow up with Willie Archer outpatient.  Therapy evaluations ordered , plan for outpatient PT.  Plan for d/c tomorrow after the last dose of IV solumedrol.     Hyperlipidemia:  Pt was on rosuvastatin, non compliant, will need referral to lipid specialist on discharge.  Will discharge him on rosuvastatin.    Mild hyponatremia.  Asymptomatic.   Vitamin D deficiency:  Supplementation added.   Hypokalemia Replaced.    DVT prophylaxis: Lovenox.  Code Status: full code.  Family Communication: none at bedside.  Disposition:   Status is: Inpatient  Remains inpatient appropriate because:  IV steroids.        Consultants:  Neurology.   Procedures: MRI brain, and cervical, thoracic, and lumbar spine.   Antimicrobials: none.    Subjective: Able to stand and walk a little.   Objective: Vitals:   07/07/21 1700 07/07/21 1930 07/07/21 2321  07/08/21 0355  BP: 135/75 131/87 133/88 (!) 143/90  Pulse:  78 90 75  Resp:  16 18 18   Temp: 98 F (36.7 C) 98 F (36.7 C) 98.1 F (36.7 C) 97.9 F (36.6 C)  TempSrc: Oral Oral Oral Oral  SpO2:  95% 96% 97%  Weight:      Height:        Intake/Output Summary (Last 24 hours) at 07/08/2021 1445 Last data filed at 07/07/2021 2133 Gross per 24 hour  Intake 173 ml  Output --  Net 173 ml    Filed Weights   07/04/21 1920 07/05/21 0147  Weight: 98.4 kg 96.2 kg    Examination:  General exam: Appears calm and comfortable  Respiratory system: Clear to auscultation.  Respiratory effort normal. Cardiovascular system: S1 & S2 heard, RRR. No JVD, No pedal edema. Gastrointestinal system: Abdomen is nondistended, soft and nontender.. Normal bowel sounds heard. Central nervous system: Alert and oriented, improving paresthesias, and strength. Not back to baseline yet.  Extremities: Symmetric 5 x 5 power. Skin: No rashes, lesions or ulcers Psychiatry: Mood & affect appropriate.       Data Reviewed: I have personally reviewed following labs and imaging studies  CBC: Recent Labs  Lab 07/04/21 2000 07/05/21 0318 07/07/21 0403  WBC 6.6 6.1 11.9*  NEUTROABS 3.8  --   --   HGB 14.6 13.7 14.1  HCT 43.4 40.6 41.4  MCV 79.2* 80.7 80.1  PLT 300 251 293     Basic Metabolic Panel: Recent Labs  Lab 07/04/21 2000 07/05/21 0318 07/06/21 0735 07/07/21 0403  NA 138 135 134* 134*  K 3.4* 3.4* 4.5 4.4  CL 101 102 104 104  CO2 27 24 20* 24  GLUCOSE 99 91 146* 143*  BUN 19 19 16  22*  CREATININE 0.99 0.97 0.88 0.94  CALCIUM 9.7 8.9 9.4 9.1     GFR: Estimated Creatinine Clearance: 132.7 mL/min (by C-G formula based on SCr of 0.94 mg/dL).  Liver Function Tests: Recent Labs  Lab 07/04/21 2000 07/05/21 0318  AST 16 14*  ALT 30 30  ALKPHOS 61 65  BILITOT 0.5 0.8  PROT 7.8 7.8  ALBUMIN 4.7 3.7     CBG: No results for input(s): GLUCAP in the last 168 hours.   Recent Results (from the past 240 hour(s))  Resp Panel by RT-PCR (Flu A&B, Covid) Nasopharyngeal Swab     Status: None   Collection Time: 07/04/21  8:03 PM   Specimen: Nasopharyngeal Swab; Nasopharyngeal(NP) swabs in vial transport medium  Result Value Ref Range Status   SARS Coronavirus 2 by RT PCR NEGATIVE NEGATIVE Final    Comment: (NOTE) SARS-CoV-2 target nucleic acids are NOT DETECTED.  The SARS-CoV-2 RNA is generally detectable in upper respiratory specimens during the acute phase of infection. The lowest concentration of SARS-CoV-2 viral copies this assay can detect is 138  copies/mL. A negative result does not preclude SARS-Cov-2 infection and should not be used as the sole basis for treatment or other patient management decisions. A negative result may occur with  improper specimen collection/handling, submission of specimen other than nasopharyngeal swab, presence of viral mutation(s) within the areas targeted by this assay, and inadequate number of viral copies(<138 copies/mL). A negative result must be combined with clinical observations, patient history, and epidemiological information. The expected result is Negative.  Fact Sheet for Patients:  07/07/21  Fact Sheet for Healthcare Providers:  07/06/21  This test is no t yet approved or cleared by the  [place] [employer] and  has been authorized for detection and/or diagnosis of SARS-CoV-2 by FDA under an TEFL teacher (EUA). This EUA will remain  in effect (meaning this test can be used) for the duration of the COVID-19 declaration under Section 564(b)(1) of the Act, 21 U.S.C.section 360bbb-3(b)(1), unless the authorization is terminated  or revoked sooner.       Influenza A by PCR NEGATIVE NEGATIVE Final   Influenza B by PCR NEGATIVE NEGATIVE Final    Comment: (NOTE) The Xpert Xpress SARS-CoV-2/FLU/RSV plus assay is intended as an aid in the diagnosis of influenza from Nasopharyngeal swab specimens and should not be used as a sole basis for treatment. Nasal washings and aspirates are unacceptable for Xpert Xpress SARS-CoV-2/FLU/RSV testing.  Fact Sheet for Patients: BloggerCourse.com  Fact Sheet for Healthcare Providers: SeriousBroker.it  This test is not yet approved or cleared by the [place] FDA and has been authorized for detection and/or diagnosis of SARS-CoV-2 by FDA under an Emergency Use Authorization (EUA). This EUA will remain in effect (meaning  this test can be used) for the duration of the COVID-19 declaration under Section 564(b)(1) of the Act, 21 U.S.C. section 360bbb-3(b)(1), unless the authorization is terminated or revoked.  Performed at [facility], [address] [place], [place] [address]           Radiology Studies: No results found.      Scheduled Meds:  enoxaparin (LOVENOX) injection  40 mg Subcutaneous Q24H   pantoprazole (PROTONIX) IV  40 mg Intravenous Q24H   polyethylene glycol  17 g Oral Daily   rosuvastatin  40 mg Oral Daily   senna-docusate  2 tablet Oral BID   sodium chloride flush  3 mL Intravenous Q12H   Vitamin D (Ergocalciferol)  50,000 Units Oral Q7 days   Continuous Infusions:  sodium chloride     methylPREDNISolone (SOLU-MEDROL) injection 1,000 mg (07/08/21 1102)     LOS: 3 days        Kathlen Mody, MD [facility]   To contact the attending provider between 7A-7P or the covering provider during after hours 7P-7A, please log into the web site [email or web address] and access using universal [place] password for that web site. If you do not have the password, please call the hospital operator.  07/08/2021, 2:45 PM

## 2021-07-08 NOTE — Progress Notes (Signed)
Physical Therapy Treatment Patient Details Name: Willie Archer MRN: [id number] DOB: [birth year] Today's Date: 07/08/2021   History of Present Illness Pt is 25 yo male who presents with BLE weakness R>L, numbness in feet,  and difficulty walking. MRI showed significant demyelination in the brain and lumbar regions. Pt with new dx of MS. PMH: HLD, B achilles surgery as a child for toe walking    PT Comments    Pt making gradual progress.  Pt with deficits in higher level balance and foot/ankle strength.  Continue to recommend outpt neuro rehab.    Recommendations for follow up therapy are one component of a multi-disciplinary discharge planning process, led by the attending physician.  Recommendations may be updated based on patient status, additional functional criteria and insurance authorization.  Follow Up Recommendations  Outpatient PT (neuro)     Assistance Recommended at Discharge PRN  Equipment Recommendations  None recommended by PT    Recommendations for Other Services       Precautions / Restrictions Precautions Precautions: None     Mobility  Bed Mobility Overal bed mobility: Independent                  Transfers Overall transfer level: Needs assistance Equipment used: None Transfers: Sit to/from Stand Sit to Stand: Supervision           General transfer comment: supervision for safety    Ambulation/Gait Ambulation/Gait assistance: Supervision Gait Distance (Feet): 200 Feet Assistive device: None Gait Pattern/deviations: Decreased dorsiflexion - right;Decreased dorsiflexion - left;Shuffle Gait velocity: decreased     General Gait Details: Pt ambulating without AD.  Cues for heel strike and picking up toes frequently. Noted Pt holds L arm abducted and stiff - encouraged to relax, unsure if deficit vs pt protecting IV site   Stairs             Wheelchair Mobility    Modified Rankin (Stroke Patients Only)       Balance  Overall balance assessment: Needs assistance   Sitting balance-Leahy Scale: Normal       Standing balance-Leahy Scale: Good Standing balance comment: Pt did have 1 LOB in standing when talking to therapist, turning, and shifting weight but did recover on his own                            Cognition Arousal/Alertness: Awake/alert Behavior During Therapy: WFL for tasks assessed/performed Overall Cognitive Status: Within Functional Limits for tasks assessed                                          Exercises      General Comments General comments (skin integrity, edema, etc.): Pt with questions about MS, recovery, rehab, etc. Educated on easily fatigues and increasing activity as able, intolerance for heat - staying cool when exercising, and discussed recovery level unpredictible (varying types of MS).  Pt with questions about return to work and accomodations - encouraged to talk to supervision and HR, would also determine more with progress and outpt PT.  Educated on safe balance exercises with counter or corner for support.  HEP including balance activities, ankle ABCs , and Toe flex/ext with metatarsals stabilized using hands.      Pertinent Vitals/Pain Pain Assessment: No/denies pain    Home Living  Prior Function            PT Goals (current goals can now be found in the care plan section) Progress towards PT goals: Progressing toward goals    Frequency           PT Plan Current plan remains appropriate    Co-evaluation              AM-PAC PT "6 Clicks" Mobility   Outcome Measure  Help needed turning from your back to your side while in a flat bed without using bedrails?: None Help needed moving from lying on your back to sitting on the side of a flat bed without using bedrails?: None Help needed moving to and from a bed to a chair (including a wheelchair)?: None Help needed standing up from a  chair using your arms (e.g., wheelchair or bedside chair)?: None Help needed to walk in hospital room?: A Little Help needed climbing 3-5 steps with a railing? : A Little 6 Click Score: 22    End of Session   Activity Tolerance: Patient tolerated treatment well Patient left: in bed;with call bell/phone within reach Nurse Communication: Mobility status PT Visit Diagnosis: Unsteadiness on feet (R26.81);Muscle weakness (generalized) (M62.81);Difficulty in walking, not elsewhere classified (R26.2);Pain     Time: 3500-9381 PT Time Calculation (min) (ACUTE ONLY): 25 min  Charges:  $Gait Training: 8-22 mins $Therapeutic Activity: 8-22 mins                     Willie Archer, PT Acute Rehab Services Pager [phone] Willie [facility] [phone]    Willie Archer 07/08/2021, 2:57 PM

## 2021-07-09 ENCOUNTER — Other Ambulatory Visit: Payer: Self-pay

## 2021-07-09 DIAGNOSIS — E785 Hyperlipidemia, unspecified: Secondary | ICD-10-CM

## 2021-07-09 DIAGNOSIS — E871 Hypo-osmolality and hyponatremia: Secondary | ICD-10-CM

## 2021-07-09 DIAGNOSIS — E876 Hypokalemia: Secondary | ICD-10-CM

## 2021-07-09 DIAGNOSIS — E559 Vitamin D deficiency, unspecified: Secondary | ICD-10-CM

## 2021-07-09 DIAGNOSIS — Z79899 Other long term (current) drug therapy: Secondary | ICD-10-CM

## 2021-07-09 DIAGNOSIS — G35 Multiple sclerosis: Secondary | ICD-10-CM

## 2021-07-09 LAB — MISC LABCORP TEST (SEND OUT): Labcorp test code: 505310

## 2021-07-09 MED ORDER — VITAMIN D (ERGOCALCIFEROL) 1.25 MG (50000 UNIT) PO CAPS
50000.0000 [IU] | ORAL_CAPSULE | ORAL | 0 refills | Status: DC
Start: 2021-07-14 — End: 2022-08-03

## 2021-07-09 NOTE — Consult Note (Signed)
° °  [facility] Inpatient Consult   07/09/2021  [name] 1995-11-23 [id number]   [facility] [[facility]]  Accountable Care Organization [ACO] Patient: [place] plan  Primary Care Provider:  Lucky Cowboy, MD is listed   Patient will have a request to be assigned to a [facility] RN Care Management for telephonic chronic disease management services.   1455:  Patient already transitioned home, referral made for support follow up.   For additional questions or referrals please contact:   Charlesetta Shanks, RN BSN CCM [facility]  [phone] business mobile phone Toll free office [phone]  Fax number: [phone] [email or web address]

## 2021-07-09 NOTE — TOC Transition Note (Signed)
Transition of Care (TOC) - CM/SW Discharge Note Donn Pierini RN,BSN Transitions of Care Unit 4NP (Non Trauma)- RN Case Manager See Treatment Team for direct Phone #    Patient Details  Name: Willie Archer MRN: [id number] Date of Birth: [birth year]  Transition of Care [facility]) CM/SW Contact:  Darrold Span, RN Phone Number: 07/09/2021, 11:53 AM   Clinical Narrative:    Donn Pierini RN,BSN Transitions of Care Unit 4NP (Non Trauma)- RN Case Manager See Treatment Team for direct Phone #    Final next level of care: OP Rehab Barriers to Discharge: No Barriers Identified   Patient Goals and CMS Choice Patient states their goals for this hospitalization and ongoing recovery are:: return home      Discharge Placement               Home        Discharge Plan and Services                 Pt seen at bedside to discuss transition needs and outpt neuro PT recommendations- pt is open to referral to outpt neuro- msg sent to MD and per MD referral has been sent to [facility] outpt Neuro for PT. Info provided to pt for follow up. No further TOC needs noted. Pt has follow up appointments in place.                     Social Determinants of Health (SDOH) Interventions     Readmission Risk Interventions Readmission Risk Prevention Plan 07/09/2021  Post Dischage Appt Complete  Medication Screening Complete  Transportation Screening Complete  Some recent data might be hidden

## 2021-07-09 NOTE — Progress Notes (Signed)
Pt discharged at this time. Pt received final dose of IV piggyback Solumedrol, PIV removed, belongings packed. Pt educated on discharge information with no additional questions. Work note given per pt request. Pt taken to private vehicle in wheelchair.   Robina Ade, RN

## 2021-07-09 NOTE — Discharge Summary (Signed)
Physician Discharge Summary  LONN IM WUX:[id number] DOB: 04/14/1996 DOA: 07/04/2021  PCP: Lucky Cowboy, MD  Admit date: 07/04/2021 Discharge date: 07/09/2021 Admitted From: Home Disposition: Home Recommendations for Outpatient Follow-up:  Follow ups as below. Please obtain CBC/BMP/Mag at follow up Please follow up on the following pending results: None Home Health: Outpatient referral to neuro rehab Equipment/Devices: None Discharge Condition: Stable CODE STATUS: Full code  Follow-up Information     Sater, Pearletha Furl, MD. Schedule an appointment as soon as possible for a visit in 1 week(s).   Specialty: Neurology Contact information: [address] [place] [address] [phone]         [facility].   Specialty: Rehabilitation Why: referral has been made for outpt PT followup Contact information: [address] [id number] mc [place] [address] [phone]               Hospital Course: 25 year old M with PMH of hyperlipidemia who presented to [facility] ER with difficulty walking, RLE weakness and paresthesia and shuffling gait.  Initially concern for GBS given preceding mild URI 2 weeks prior.  Neurology consulted.  He received a dose of IVIG.  However, MRI brain, cervical, thoracic and lumbar spine showed extensive demyelinating pattern in brain, cervical, thoracic or lumbar cord and conus medullaris suggesting MS.  Patient was started on high-dose Solu-Medrol for 5 days that he completed on 07/09/2021 with improvement in his symptoms.  He was cleared for discharge by neurology for outpatient follow-up.  Ambulatory referral to outpatient neuro rehab ordered as recommended by therapy.  See individual problem list below for more on hospital course.  Discharge Diagnoses:  Multiple sclerosis: Noted on MRI.  ACE level, HIV, Lyme titer and NMO antibody negative.  Symptoms  improved. -Completed high-dose Solu-Medrol for 5 days on 07/09/2021 -Outpatient follow-up with neurology -Ambulatory referral to neuro rehab for therapy  Vitamin D deficiency: Vitamin D level 10. -Vitamin D 50,000 international units weekly x8 -Needs daily vitamin D supplementation after the  Hyperlipidemia: LDL 255. -Consider starting statin once his emesis under good control -Recommend outpatient referral to lipid clinic     Mild hyponatremia.  Hyponatremia: Resolved.    Hypokalemia: Resolved.  Class I obesity Body mass index is 32.25 kg/m.  -Encourage lifestyle change to lose weight.         Discharge Exam: Vitals:   07/08/21 1930 07/08/21 2325 07/09/21 0331 07/09/21 0803  BP: 140/78 (!) 142/92 133/76 120/69  Pulse: 79 78 60 65  Temp: 98.1 F (36.7 C) 98.4 F (36.9 C) 98.2 F (36.8 C) 97.8 F (36.6 C)  Resp: Height:      Weight:      SpO2: 92% 94% 96% 93%  TempSrc: Oral Oral Oral Oral  BMI (Calculated):         GENERAL: No apparent distress.  Nontoxic. HEENT: MMM.  Vision and hearing grossly intact.  NECK: Supple.  No apparent JVD.  RESP:  No IWOB.  Fair aeration bilaterally. CVS:  RRR. Heart sounds normal.  ABD/GI/GU: Bowel sounds present. Soft. Non tender.  MSK/EXT:  Moves extremities. No apparent deformity. No edema.  SKIN: no apparent skin lesion or wound NEURO: Awake and alert.  Oriented appropriately.  No apparent focal neuro deficit. PSYCH: Calm. Normal affect.   Discharge Instructions  Discharge Instructions     Ambulatory Referral to Neuro Rehab   Complete by: As directed    Multiple sclerosis  Ambulatory referral to Neurology   Complete by: As directed    An appointment is requested in approximately: 1-2 weeks with Dr. Epimenio Foot   Diet general   Complete by: As directed    Discharge instructions   Complete by: As directed    It has been a pleasure taking care of you!  You were hospitalized due to multiple sclerosis for  which you have been treated with steroid injections.  We have ordered ambulatory referrals for neurology and neuro rehab. Someone should contact you about these referrals in the next one to two weeks.  Not that your vit D level is low. We started you on weekly vit D capsule   Take care,   Increase activity slowly   Complete by: As directed       Allergies as of 07/09/2021   No Known Allergies      Medication List     STOP taking these medications    VITAMIN D PO       TAKE these medications    B-12 PO Take 1 tablet by mouth daily.   Fluarix Quadrivalent 0.5 ML injection Generic drug: influenza vac split quadrivalent PF Inject 0.5 mLs into the muscle.   MULTIVITAMIN ADULT PO Take 1 tablet by mouth daily.   Vitamin D (Ergocalciferol) 1.25 MG (50000 UNIT) Caps capsule Commonly known as: DRISDOL Take 1 capsule (50,000 Units total) by mouth every 7 (seven) days. Start taking on: July 14, 2021        Consultations: Neurology  Procedures/Studies:   MR BRAIN W WO CONTRAST  Result Date: 07/05/2021 CLINICAL DATA:  Neuro degenerative disorder suspected. Neuro deficit. EXAM: MRI HEAD WITHOUT AND WITH CONTRAST TECHNIQUE: Multiplanar, multiecho pulse sequences of the brain and surrounding structures were obtained without and with intravenous contrast. CONTRAST:  9.76mL GADAVIST GADOBUTROL 1 MMOL/ML IV SOLN COMPARISON:  None. FINDINGS: Brain: Extensive FLAIR hyperintensity in the cerebral white matter, especially around the lateral ventricles with ovoid radiating pattern and a few areas of nodular plaque-like enhancement. Infratentorial involvement is also present the left deep cerebellum (where there is also enhancement) and ventral right pons. Juxtacortical plaques are also present. Brain volume is normal. Vascular: Normal flow voids and vascular enhancements. Skull and upper cervical spine: Normal marrow signal Sinuses/Orbits: Complete opacification of the left  maxillary sinus primarily from submucosal edema or retention cyst by postcontrast imaging. Mucosal thickening also seen in the posterior ethmoid/sphenoid sinuses. IMPRESSION: Demyelinating pattern which is extensive and shows signs of active disease. Findings would be consistent with multiple sclerosis. Electronically Signed   By: Tiburcio Pea M.D.   On: 07/05/2021 11:06   MR CERVICAL SPINE W WO CONTRAST  Result Date: 07/05/2021 CLINICAL DATA:  Acute myelopathy EXAM: MRI CERVICAL SPINE WITHOUT AND WITH CONTRAST TECHNIQUE: Multiplanar and multiecho pulse sequences of the cervical spine, to include the craniocervical junction and cervicothoracic junction, were obtained without and with intravenous contrast. CONTRAST:  9.60mL GADAVIST GADOBUTROL 1 MMOL/ML IV SOLN COMPARISON:  None. FINDINGS: Alignment: Normal Vertebrae: No fracture, evidence of discitis, or bone lesion. Cord: Multiple short segment T2 hyperintensities seen in the ventral cord at: The central cord at C2-3 Dorsal and right lateral cord at C4 Central and right lateral cord at C6 Ventral cord at C6-7 Left-sided cord at C7. Ill-defined enhancement is associated with the ventral cord plaque at C6-7. Posterior Fossa, vertebral arteries, paraspinal tissues: Negative Disc levels: Preserved disc height and hydration diffusely.  Negative facets. IMPRESSION: Diffuse short segment cord signal abnormalities compatible  with demyelinating plaques. A plaque in the ventral cord at C6-7 is enhancing. Electronically Signed   By: Tiburcio Pea M.D.   On: 07/05/2021 11:24   MR THORACIC SPINE W WO CONTRAST  Result Date: 07/05/2021 CLINICAL DATA:  Acute myelopathy EXAM: MRI THORACIC WITHOUT AND WITH CONTRAST TECHNIQUE: Multiplanar and multiecho pulse sequences of the thoracic spine were obtained without and with intravenous contrast. CONTRAST:  9.24mL GADAVIST GADOBUTROL 1 MMOL/ML IV SOLN COMPARISON:  None. FINDINGS: Alignment:  Unremarkable Vertebrae: No  fracture, evidence of discitis, or bone lesion. Cord: Multiple short segment cord signal abnormalities in the right cord at T1 Right cord at T2 Central cord at T3-4 Left posterior cord at T8 Hazily in the central cord at T9 Posterior cord at T112 Paraspinal and other soft tissues: Unremarkable Disc levels: Diffusely preserved disc height and hydration. No degenerative impingement IMPRESSION: Numerous cord signal abnormalities compatible with demyelinating plaques. Enhancing plaques are seen at T2-3, T9, and T11. Electronically Signed   By: Tiburcio Pea M.D.   On: 07/05/2021 11:30   MR LUMBAR SPINE W WO CONTRAST  Result Date: 07/06/2021 CLINICAL DATA:  Demyelinating disease EXAM: MRI LUMBAR SPINE WITHOUT AND WITH CONTRAST TECHNIQUE: Multiplanar and multiecho pulse sequences of the lumbar spine were obtained without and with intravenous contrast. CONTRAST:  16mL GADAVIST GADOBUTROL 1 MMOL/ML IV SOLN COMPARISON:  None. FINDINGS: Segmentation:  Standard Alignment:  Physiologic. Vertebrae:  No fracture, evidence of discitis, or bone lesion. Conus medullaris and cauda equina: Conus extends to the L1 level. There is abnormal cord signal within the distal cord and conus medullaris, at T11, T12, and L1. There is a partially visualized cord enhancement at the level of T11 (sagittal postcontrast image 9). There are no enhancing cauda quinine nerve roots. No nerve root clumping. Paraspinal and other soft tissues: Negative. Disc levels: There is minimal disc bulging at L3-L4, L4-L5, and L5-S1. There is no significant spinal canal stenosis or neural foraminal narrowing in the lumbar spine. IMPRESSION: Abnormal cord signal within the distal cord and conus medullaris at T11, T12, and L1 consistent with demyelinating plaques. Partially visualized enhancing plaque at T11, consistent with active demyelination. Normal cauda equina nerve roots. Electronically Signed   By: Caprice Renshaw M.D.   On: 07/06/2021 13:45       The  results of significant diagnostics from this hospitalization (including imaging, microbiology, ancillary and laboratory) are listed below for reference.     Microbiology: Recent Results (from the past 240 hour(s))  Resp Panel by RT-PCR (Flu A&B, Covid) Nasopharyngeal Swab     Status: None   Collection Time: 07/04/21  8:03 PM   Specimen: Nasopharyngeal Swab; Nasopharyngeal(NP) swabs in vial transport medium  Result Value Ref Range Status   SARS Coronavirus 2 by RT PCR NEGATIVE NEGATIVE Final    Comment: (NOTE) SARS-CoV-2 target nucleic acids are NOT DETECTED.  The SARS-CoV-2 RNA is generally detectable in upper respiratory specimens during the acute phase of infection. The lowest concentration of SARS-CoV-2 viral copies this assay can detect is 138 copies/mL. A negative result does not preclude SARS-Cov-2 infection and should not be used as the sole basis for treatment or other patient management decisions. A negative result may occur with  improper specimen collection/handling, submission of specimen other than nasopharyngeal swab, presence of viral mutation(s) within the areas targeted by this assay, and inadequate number of viral copies(<138 copies/mL). A negative result must be combined with clinical observations, patient history, and epidemiological information. The expected result is Negative.  Fact Sheet for Patients:  BloggerCourse.com  Fact Sheet for Healthcare Providers:  SeriousBroker.it  This test is no t yet approved or cleared by the [place] FDA and  has been authorized for detection and/or diagnosis of SARS-CoV-2 by FDA under an Emergency Use Authorization (EUA). This EUA will remain  in effect (meaning this test can be used) for the duration of the COVID-19 declaration under Section 564(b)(1) of the Act, 21 U.S.C.section 360bbb-3(b)(1), unless the authorization is terminated  or revoked sooner.        Influenza A by PCR NEGATIVE NEGATIVE Final   Influenza B by PCR NEGATIVE NEGATIVE Final    Comment: (NOTE) The Xpert Xpress SARS-CoV-2/FLU/RSV plus assay is intended as an aid in the diagnosis of influenza from Nasopharyngeal swab specimens and should not be used as a sole basis for treatment. Nasal washings and aspirates are unacceptable for Xpert Xpress SARS-CoV-2/FLU/RSV testing.  Fact Sheet for Patients: BloggerCourse.com  Fact Sheet for Healthcare Providers: SeriousBroker.it  This test is not yet approved or cleared by the [place] FDA and has been authorized for detection and/or diagnosis of SARS-CoV-2 by FDA under an Emergency Use Authorization (EUA). This EUA will remain in effect (meaning this test can be used) for the duration of the COVID-19 declaration under Section 564(b)(1) of the Act, 21 U.S.C. section 360bbb-3(b)(1), unless the authorization is terminated or revoked.  Performed at [facility], [address], [place], [place] [address]      Labs:  CBC: Recent Labs  Lab 07/04/21 2000 07/05/21 0318 07/07/21 0403  WBC 6.6 6.1 11.9*  NEUTROABS 3.8  --   --   HGB 14.6 13.7 14.1  HCT 43.4 40.6 41.4  MCV 79.2* 80.7 80.1  PLT 300 251 293   BMP &GFR Recent Labs  Lab 07/04/21 2000 07/05/21 0318 07/06/21 0735 07/07/21 0403  NA 138 135 134* 134*  K 3.4* 3.4* 4.5 4.4  CL 101 102 104 104  CO2 27 24 20* 24  GLUCOSE 99 91 146* 143*  BUN 19 19 16  22*  CREATININE 0.99 0.97 0.88 0.94  CALCIUM 9.7 8.9 9.4 9.1   Estimated Creatinine Clearance: 132.7 mL/min (by C-G formula based on SCr of 0.94 mg/dL). Liver & Pancreas: Recent Labs  Lab 07/04/21 2000 07/05/21 0318  AST 16 14*  ALT 30 30  ALKPHOS 61 65  BILITOT 0.5 0.8  PROT 7.8 7.8  ALBUMIN 4.7 3.7   No results for input(s): LIPASE, AMYLASE in the last 168 hours. No results for input(s): AMMONIA in the last 168  hours. Diabetic: No results for input(s): HGBA1C in the last 72 hours. No results for input(s): GLUCAP in the last 168 hours. Cardiac Enzymes: No results for input(s): CKTOTAL, CKMB, CKMBINDEX, TROPONINI in the last 168 hours. No results for input(s): PROBNP in the last 8760 hours. Coagulation Profile: No results for input(s): INR, PROTIME in the last 168 hours. Thyroid Function Tests: No results for input(s): TSH, T4TOTAL, FREET4, T3FREE, THYROIDAB in the last 72 hours. Lipid Profile: No results for input(s): CHOL, HDL, LDLCALC, TRIG, CHOLHDL, LDLDIRECT in the last 72 hours. Anemia Panel: No results for input(s): VITAMINB12, FOLATE, FERRITIN, TIBC, IRON, RETICCTPCT in the last 72 hours. Urine analysis:    Component Value Date/Time   COLORURINE CANCELED 03/20/2016 1115   APPEARANCEUR CANCELED 03/20/2016 1115   LABSPEC CANCELED 03/20/2016 1115   PHURINE CANCELED 03/20/2016 1115   GLUCOSEU CANCELED 03/20/2016 1115   HGBUR CANCELED 03/20/2016 1115   BILIRUBINUR CANCELED 03/20/2016  1115   KETONESUR CANCELED 03/20/2016 1115   PROTEINUR CANCELED 03/20/2016 1115   NITRITE CANCELED 03/20/2016 1115   LEUKOCYTESUR CANCELED 03/20/2016 1115   Sepsis Labs: Invalid input(s): PROCALCITONIN, LACTICIDVEN   Time coordinating discharge: 45 minutes  SIGNED:  Almon Hercules, MD  [facility] 07/09/2021, 5:58 PM

## 2021-07-10 NOTE — Patient Outreach (Signed)
Received a discharge referral from [place] for follow up calls and assess for further needs.   I have Assigned Elliot Cousin, RN to follow up.

## 2021-07-11 ENCOUNTER — Other Ambulatory Visit: Payer: Self-pay | Admitting: *Deleted

## 2021-07-11 NOTE — Patient Outreach (Signed)
[facility]) Care Management  07/11/2021  DAMIEN BATTY 1995/10/13 [id number]   Transition of care telephone call Referral received:07/10/2021 Initial outreach: 07/11/2021 Admission: Guillain Barre syndrome Insurance: [employer]  Initial telephone call was unsuccessful to patient's listed telephone number, no answer received. RN left a HIPAA compliant voicemail message with contact information, requesting a return call.   Will send outreach letter and attempt another call over the next week.  Elliot Cousin, RN Care Management Coordinator [facility] Main Office [phone]

## 2021-07-17 ENCOUNTER — Other Ambulatory Visit: Payer: Self-pay

## 2021-07-17 ENCOUNTER — Other Ambulatory Visit: Payer: Self-pay | Admitting: *Deleted

## 2021-07-17 ENCOUNTER — Ambulatory Visit: Payer: 59 | Attending: Internal Medicine

## 2021-07-17 DIAGNOSIS — M6281 Muscle weakness (generalized): Secondary | ICD-10-CM | POA: Diagnosis not present

## 2021-07-17 DIAGNOSIS — R2689 Other abnormalities of gait and mobility: Secondary | ICD-10-CM | POA: Diagnosis not present

## 2021-07-17 NOTE — Patient Outreach (Signed)
[facility]) Care Management  07/17/2021  Willie Archer [birth year] [id number]   Transition of care telephone call Referral received:12/22 2nd outreach:12/29 Issue : Willie Archer Insurance: [employer] [employer]  Another telephone call was unsuccessful to patient's listed telephone number. RN left a HIPAA compliant voicemail message with contact information, requesting a return call.   Will attempt another outreach call over the next week for ongoing transition of care follow up.  Elliot Cousin, RN Care Management Coordinator [facility] Main Office [phone]

## 2021-07-18 NOTE — Therapy (Signed)
[facility] [address] [place], [place], [address] Phone: [phone]   Fax:  [phone]  Physical Therapy Evaluation  Patient Details  Name: Willie Archer MRN: [id number] Date of Birth: 1996/05/10 Referring Provider (PT): Candelaria Stagers (hospitalist)  but will follow with Despina Arias so sending referral there.   Encounter Date: 07/17/2021   PT End of Session - 07/17/21 1453     Visit Number 1    Number of Visits 5    Date for PT Re-Evaluation 08/15/21    PT Start Time 1450    PT Stop Time 1535    PT Time Calculation (min) 45 min    Activity Tolerance Patient tolerated treatment well    Behavior During Therapy [facility] for tasks assessed/performed             Past Medical History:  Diagnosis Date   Hyperlipidemia    Vitamin D deficiency     Past Surgical History:  Procedure Laterality Date   ACHILLES TENDON SURGERY Bilateral 2007   Dr Cheryll Cockayne    There were no vitals filed for this visit.    Subjective Assessment - 07/17/21 1455     Subjective Pt was hospitalized 12/16-12/21/22   Hospitalized 12/16-12/21/22. Pt is 25 yo male who presents with BLE weakness R>L, numbness in feet,  and difficulty walking. MRI showed significant demyelination in the brain and lumbar regions. Pt with new dx of MS. Patient was started on high-dose Solu-Medrol for 5 days that he completed on 07/09/2021 with improvement in his symptoms. 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 He saw an urgent care MD and they thought could be anxiety related and was prescribed something for that and went away after a few weeks. He was led to hospital this time as he was having trouble walking. Pt reports that a lot of times he would just kind of power through with issues such as fatigue. Pt reports that since coming home from hospital he is walking much better. Not using any  AD. Pt will be following up with Dr. Despina Arias. Does not have current MD and going to walk over there after session. He feels that he is wobbling more with his walking than he used to, feels more fatigued overall.    Pertinent History HLD, B achilles surgery as a child for toe walking    Patient Stated Goals Pt wants to improve his overall fitness.    Currently in Pain? Yes   only when stands for prolonged periods in feet   Pain Score 0-No pain    Pain Location Foot    Pain Orientation Right;Left    Pain Descriptors / Indicators Other (Comment)   strange feeling in toes.   Pain Onset 1 to 4 weeks ago    Pain Frequency Intermittent    Aggravating Factors  prolonged standing                [facility] PT Assessment - 07/17/21 1502       Assessment   Medical Diagnosis MS    Referring Provider (PT) Candelaria Stagers (hospitalist)  but will follow with Despina Arias so sending referral there.    Onset Date/Surgical Date 07/04/21    Hand Dominance Right      Precautions   Precautions None      Balance Screen   Has the patient fallen in the past  6 months No    Has the patient had a decrease in activity level because of a fear of falling?  Yes    Is the patient reluctant to leave their home because of a fear of falling?  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    Leisure basketball, watching sports, video games      Cognition   Overall Cognitive Status Within Functional Limits for tasks assessed      Sensation    Light Touch Appears Intact      Coordination   Fine Motor Movements are Fluid and Coordinated Yes   slightly slower on right compared to left     ROM / Strength   AROM / PROM / Strength Strength      Strength   Strength Assessment Site Shoulder;Elbow;Hand;Hip;Knee;Ankle    Right/Left Shoulder Right;Left    Right Shoulder Flexion 4+/5    Left Shoulder Flexion 5/5    Right/Left Elbow Right;Left    Right Elbow Flexion 5/5    Right Elbow Extension 5/5    Left Elbow Flexion 5/5    Left Elbow Extension 5/5    Right/Left hand Right;Left    Right Hand Gross Grasp Functional    Left Hand Gross Grasp Functional    Right/Left Hip Right;Left    Right Hip Flexion 4/5    Right Hip ABduction 4/5    Left Hip Flexion 4+/5    Left Hip ABduction 4+/5    Right/Left Knee Right;Left    Right Knee Flexion 4+/5    Right Knee Extension 4+/5    Left Knee Flexion 4+/5    Left Knee Extension 4+/5    Right/Left Ankle Right;Left    Right Ankle Dorsiflexion 4/5    Right Ankle Plantar Flexion 4/5    Left Ankle Dorsiflexion 4+/5    Left Ankle Plantar Flexion 4+/5      Transfers   Transfers Sit to Stand;Stand to Sit    Sit to Stand 7: Independent    Stand to Sit 7: Independent      Ambulation/Gait   Ambulation/Gait Yes    Ambulation/Gait Assistance 6: Modified independent (Device/Increase time)    Ambulation/Gait Assistance Details Pt has increased lateral sway.    Ambulation Distance (Feet) 150 Feet    Assistive device None    Gait Pattern Step-through pattern;Lateral hip instability    Ambulation Surface Level;Indoor    Gait velocity 10.12 sec=0.98 m/s    Stairs Yes    Stairs Assistance 7: Independent    Stair Management Technique Alternating pattern;No rails    Number of Stairs 4    Height of Stairs 6      High Level Balance   High Level Balance Comments SLS >20 sec each leg      Functional Gait  Assessment   Gait assessed  Yes    Gait Level Surface Walks  20 ft in less than 7 sec  but greater than 5.5 sec, uses assistive device, slower speed, mild gait deviations, or deviates 6-10 in outside of the 12 in walkway width.    Change in Gait Speed Able to smoothly change walking speed without loss of balance or gait deviation. Deviate no more than 6 in outside of the 12 in walkway width.    Gait with Horizontal Head Turns Performs head turns smoothly with no change in gait. Deviates no more than 6 in outside 12 in walkway width    Gait with Vertical Head Turns Performs head turns with no change in gait. Deviates no more than 6 in outside 12 in walkway width.    Gait and Pivot Turn Pivot turns safely within 3 sec and stops quickly with no loss of balance.    Step Over Obstacle Is able to step over 2 stacked shoe boxes taped together (9 in total height) without changing gait speed. No evidence of imbalance.    Gait with Narrow Base of Support Is able to ambulate for 10 steps heel to toe with no staggering.    Gait with Eyes Closed Walks 20 ft, uses assistive device, slower speed, mild gait deviations, deviates 6-10 in outside 12 in walkway width. Ambulates 20 ft in less than 9 sec but greater than 7 sec.    Ambulating Backwards Walks 20 ft, no assistive devices, good speed, no evidence for imbalance, normal gait    Steps Alternating feet, no rail.    Total Score 28                        Objective measurements completed on examination: See above findings.                PT Education - 07/18/21 0854     Education Details PT plan of care    Person(s) Educated Patient    Methods Explanation    Comprehension Verbalized understanding              PT Short Term Goals - 07/18/21 0910       PT SHORT TERM GOAL #1   Title STGs=LTGs               PT Long Term Goals - 07/18/21 0911       PT LONG TERM GOAL #1   Title Pt will be independent with HEP for strengthening, high level balance and aerobic exercises to continue gains on own. (LTGs  due 08/15/21)    Time 4    Period Weeks    Status New    Target Date 08/15/21      PT LONG TERM GOAL #2   Title Pt will increase gait speed to >1.63m/s for improved community mobility.    Baseline 07/17/21 0.52m/s    Time 4    Period Weeks    Status New    Target Date 08/15/21      PT LONG TERM GOAL #3   Title 6 min walk goal TBD    Time 4    Period Weeks    Status New    Target Date 08/15/21      PT LONG TERM GOAL #4   Title Pt will be able to tolerate >10 min of moderate intensity activity to simulate work tasks as well with <3/10 RPE for improved activity tolerance.    Time 4    Period Weeks    Status New  Target Date 08/15/21                    Plan - 07/18/21 0855     Clinical Impression Statement Pt is 25 y/o male who was recently diagnosed with MS after hospital stay. Was started on high-dose Solu-Medrol for 5 days that he completed on 07/09/2021 with improvement in his symptoms. RLE noted to be slightly weaker than left with strength grossly 4/5 on right. Pt is ambulating at gait speed of 0.74m/s which is safe community speed but decreased from norms. He does have increased lateral sway with gait. Functional Gait Assessment score of 28/30 indicates low fall risk. Pt also did well with SLS >20 sec each leg. Pt reports that fatigue in general is one of his bigger issues as just wakes up tired. Pt will benefit from PT to address strength and activity tolerance deficits.    Personal Factors and Comorbidities Comorbidity 2    Comorbidities HLD, B achilles surgery as a child for toe walking    Examination-Activity Limitations Locomotion Level    Examination-Participation Restrictions Occupation;Community Activity    Stability/Clinical Decision Making Evolving/Moderate complexity    Clinical Decision Making Moderate    Rehab Potential Good    PT Frequency 1x / week   plus eval   PT Duration 4 weeks    PT Treatment/Interventions ADLs/Self Care Home  Management;Neuromuscular re-education;Functional mobility training;Gait training;Stair training;Therapeutic activities;Therapeutic exercise;Manual techniques;Energy conservation    PT Next Visit Plan Next visit further assess activity tolerance with 6 min walk and update goal. Establish initial HEP for BLE strengthening with focus more on right as grossly 4/5 on right, functional strengthening. High level dynamic gait and balance activities. 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 [place], [place], [address] Phone: [phone]   Fax:  [phone]  Name: ABDELRAHMAN NAIR MRN: [id number] Date of Birth: [birth year]

## 2021-07-22 ENCOUNTER — Ambulatory Visit: Payer: 59

## 2021-07-23 ENCOUNTER — Other Ambulatory Visit: Payer: Self-pay | Admitting: *Deleted

## 2021-07-23 DIAGNOSIS — E559 Vitamin D deficiency, unspecified: Secondary | ICD-10-CM | POA: Diagnosis not present

## 2021-07-23 DIAGNOSIS — E785 Hyperlipidemia, unspecified: Secondary | ICD-10-CM | POA: Diagnosis not present

## 2021-07-23 NOTE — Patient Outreach (Signed)
[facility]) Care Management  07/23/2021  DWYANE DUPREE 1996-05-26 [id number]   Transition of care telephone call Referral received: 07/10/21 Outreach #3: 07/23/2021 Insurance: [employer]  Telephone call was unsuccessful to patient's listed telephone number, no answer received. RN left a HIPAA compliant voicemail message with contact information requesting a return call.   Will reschedule another outreach call over the next month for pending [facility] services.   Elliot Cousin, RN Care Management Coordinator [facility] Main Office [phone]

## 2021-07-28 NOTE — Progress Notes (Signed)
[facility]  PATIENT: Willie Archer DOB: 1995/11/05  REFERRING DOCTOR OR PCP:  Unk Pinto, MD; Wendee Beavers, MD SOURCE: Patient, extensive notes from recent hospitalization, imaging and laboratory reports, multiple MRI scans were reviewed personally  _________________________________   HISTORICAL  CHIEF COMPLAINT:  Chief Complaint  Patient presents with   New Patient (Initial Visit)    Rm 2, w mother Sharyn [place]. Internal referral for MS. Here to f/u from hospital visit where he was dx with MS. Pt has noticed tingling in R foot, that started today.     HISTORY OF PRESENT ILLNESS:  I had the pleasure of seeing your patient, Willie Archer, at the [place] [facility] for a neurologic consultation regarding his recent diagnosis of multiple sclerosis.  He is a 26 year old man who was diagnosed with MS December 2022 after presenting with leg weakness and difficulty walking.  He presented to the emergency room 07/04/2021 with 4 days of worsening numbness, weakness and gait issues.  He did have an upper respiratory infection about 2 weeks before the onset of symptoms.  He was admitted.  In initially, he was diagnosed with GBS and IVIG was started.  However, when noted to have hyperreflexia and abnormal signal in the lower spinal cord seen on lumbar MRI, a demyelinating disease was felt to be more likely and he had further MRI studies performed and was placed on IV steroids.  They were consistent with MS.   He received 5 days of IV Solu-medrol.   He notes improvement but not to baseline.    He is doing PT.    His right side is clumsier and weaker than his left.     Thinking back, around May 2022 he had right > left hand numbness lasting 2-3 weeks.  He also had allodynia in the trunk/stomach area.  He went to an Urgent Care and was told symptoms may have been due to stress (starting a new job).  He states even when younger his gait was a little  different.     Currently, he notes gait is off balanced.   He reports right sided weakness and numbness> left, legs > arms.   He reports some tingling in the right foot that started today.  This was not associated with any weakness or clumsiness.  He has urinary urgency but no incontinence.   He notes mild change in vision, worse on the right but no change in color vision.  He has felt much more fatigued the past month.   He sleeps poorly due to sleep onset more than maintenance issues.   He only has 1 x nocturia  He notes some depression.   He notes no definite cognitive change.   He has noted some issues with word finding.      Imaging personally reviewed: MRI of the brain 07/05/2021 shows multiple T2/FLAIR hyperintense foci in the periventricular, juxtacortical and deep white matter of both hemispheres.  Foci in the left cerebellar hemorrhage and at least 4 foci in the cerebral hemispheres enhance after contrast consistent with acute demyelinating plaque associated with multiple sclerosis.  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  MRI of the cervical spine 07/05/2021 shows an enhancing plaque in the anterior spinal cord adjacent to C6-C7.  Nonenhancing foci are noted centrally adjacent to C2-C3 and anteriorly and posteriorly adjacent to C3, to the right adjacent to C4, posterolaterally to the left adjacent to C4, to the right  adjacent to C6 and to the left adjacent to C7.  No degenerative changes are noted.  MRI of the thoracic spine 07/05/2021 shows multiple T2 hyperintense foci throughout the spinal cord with enhancing plaques at T2-T3, T9 and T11.  MRI of the lumbar spine 07/06/2021 showed abnormal cord signal in the distal spinal cord and conus medullaris consistent with demyelination.  Cauda equina nerve roots were normal.  Laboratory test December 2022: Vitamin D was low at 10.0. Anti-NMO, anti-MOG,, HIV, Lyme, B12, ESR were normal or  negative.   No close FH of MS.    REVIEW OF SYSTEMS: Constitutional: No fevers, chills, sweats, or change in appetite.  He has insomnia. Eyes: No visual changes, double vision, eye pain Ear, nose and throat: No hearing loss, ear pain, nasal congestion, sore throat Cardiovascular: No chest pain, palpitations Respiratory:  No shortness of breath at rest or with exertion.   No wheezes GastrointestinaI: No nausea, vomiting, diarrhea, abdominal pain, fecal incontinence Genitourinary:  No dysuria, urinary retention or frequency.  No nocturia. Musculoskeletal:  No neck pain, back pain Integumentary: No rash, pruritus, skin lesions Neurological: as above Psychiatric: No depression at this time.  No anxiety Endocrine: No palpitations, diaphoresis, change in appetite, change in weigh or increased thirst Hematologic/Lymphatic:  No anemia, purpura, petechiae. Allergic/Immunologic: No itchy/runny eyes, nasal congestion, recent allergic reactions, rashes  ALLERGIES: No Known Allergies  HOME MEDICATIONS:  Current Outpatient Medications:    Cyanocobalamin (B-12 PO), Take 1 tablet by mouth daily., Disp: , Rfl:    Multiple Vitamin (MULTIVITAMIN ADULT PO), Take 1 tablet by mouth daily., Disp: , Rfl:    rosuvastatin (CRESTOR) 40 MG tablet, Take 40 mg by mouth daily., Disp: , Rfl:    traZODone (DESYREL) 50 MG tablet, Take 1 tablet (50 mg total) by mouth at bedtime., Disp: 30 tablet, Rfl: 5   Vitamin D, Ergocalciferol, (DRISDOL) 1.25 MG (50000 UNIT) CAPS capsule, Take 1 capsule (50,000 Units total) by mouth every 7 (seven) days., Disp: 7 capsule, Rfl: 0  PAST MEDICAL HISTORY: Past Medical History:  Diagnosis Date   Hyperlipidemia    MS (multiple sclerosis) ([place])    Vitamin D deficiency     PAST SURGICAL HISTORY: Past Surgical History:  Procedure Laterality Date   ACHILLES TENDON SURGERY Bilateral 2007   Dr Ander Gaster    FAMILY HISTORY: Family History  Problem Relation Age of Onset   Diabetes  Mother    Diabetes Father     SOCIAL HISTORY:  Social History   Socioeconomic History   Marital status: Single    Spouse name: Not on file   Number of children: Not on file   Years of education: Not on file   Highest education level: Bachelor's degree (e.g., BA, AB, BS)  Occupational History   Not on file  Tobacco Use   Smoking status: Never   Smokeless tobacco: Never  Substance and Sexual Activity   Alcohol use: Yes    Alcohol/week: 0.0 standard drinks    Comment: occasional   Drug use: No   Sexual activity: Not on file  Other Topics Concern   Not on file  Social History Narrative   Lives with parents   Right handed   Caffeine: no soda, occa coffee   Social Determinants of Health   Financial Resource Strain: Not on file  Food Insecurity: Not on file  Transportation Needs: Not on file  Physical Activity: Not on file  Stress: Not on file  Social Connections: Not on file  Intimate Partner Violence: Not on file     PHYSICAL EXAM  Vitals:   07/29/21 1332  BP: 122/81  Pulse: 97  Weight: 210 lb 8 oz (95.5 kg)  Height: 5' 8"  (1.727 m)    Vision Screening   Right eye Left eye Both eyes  Without correction     With correction 20/40 20/40 20/40   Comments: Current glasses are over 86 years old    Body mass index is 32.01 kg/m.   General: The patient is well-developed and well-nourished and in no acute distress  HEENT:  Head is [place]/AT.  Sclera are anicteric.  Funduscopic exam shows normal optic discs and retinal vessels.  Neck: No carotid bruits are noted.  The neck is nontender.  Cardiovascular: The heart has a regular rate and rhythm with a normal S1 and S2. There were no murmurs, gallops or rubs.    Skin: Extremities are without rash or  edema.  Musculoskeletal:  Back is nontender  Neurologic Exam  Mental status: The patient is alert and oriented x 3 at the time of the examination. The patient has apparent normal recent and remote memory, with an  apparently normal attention span and concentration ability.   Speech is normal.  Cranial nerves: Extraocular movements are full. Pupils are equal, round, and reactive to light and accomodation.  Symmetric color vision.  Facial symmetry is present. There is good facial sensation to soft touch bilaterally.Facial strength is normal.  Trapezius and sternocleidomastoid strength is normal. No dysarthria is noted.  The tongue is midline, and the patient has symmetric elevation of the soft palate. No obvious hearing deficits are noted.  Motor:  Muscle bulk is normal.   Tone is normal. Strength is  5 / 5 in all 4 extremities.   Sensory: Sensory testing is intact to pinprick, soft touch and vibration sensation in arms .  Reduced temperature left trunk/legs compared to right  Symmetric vibraiton arms and legs.     Coordination: Cerebellar testing reveals good finger-nose-finger and heel-to-shin bilaterally.  Gait and station: Station is normal.   Gait is gait is minimally wide.  Tandem gait is mildly wide.. Romberg is negative.   Reflexes: Deep tendon reflexes are symmetric and normal bilaterally.   Plantar responses are flexor.    DIAGNOSTIC DATA (LABS, IMAGING, TESTING) - I reviewed patient records, labs, notes, testing and imaging myself where available.  Lab Results  Component Value Date   WBC 11.9 (H) 07/07/2021   HGB 14.1 07/07/2021   HCT 41.4 07/07/2021   MCV 80.1 07/07/2021   PLT 293 07/07/2021      Component Value Date/Time   NA 134 (L) 07/07/2021 0403   K 4.4 07/07/2021 0403   CL 104 07/07/2021 0403   CO2 24 07/07/2021 0403   GLUCOSE 143 (H) 07/07/2021 0403   BUN 22 (H) 07/07/2021 0403   CREATININE 0.94 07/07/2021 0403   CREATININE 0.97 03/20/2016 1115   CALCIUM 9.1 07/07/2021 0403   PROT 7.8 07/05/2021 0318   ALBUMIN 3.7 07/05/2021 0318   AST 14 (L) 07/05/2021 0318   ALT 30 07/05/2021 0318   ALKPHOS 65 07/05/2021 0318   BILITOT 0.8 07/05/2021 0318   GFRNONAA >60 07/07/2021  0403   GFRNONAA >89 03/20/2016 1115   GFRAA >89 03/20/2016 1115   Lab Results  Component Value Date   CHOL 327 (H) 07/05/2021   HDL 35 (L) 07/05/2021   LDLCALC 255 (H) 07/05/2021   TRIG 183 (H) 07/05/2021   CHOLHDL 9.3 07/05/2021  Lab Results  Component Value Date   HGBA1C 5.7 (H) 07/04/2021   Lab Results  Component Value Date   VITAMINB12 392 07/06/2021   Lab Results  Component Value Date   TSH 2.47 03/20/2016       ASSESSMENT AND PLAN  Multiple sclerosis ([place]) - Plan: Hepatitis B surface antigen, QuantiFERON-TB Gold Plus, Stratify JCV(TM) Ab w/Index, Hepatitis B surface antibody,qualitative, Hepatitis C antibody, Hepatitis B core antibody, total, Varicella zoster antibody, IgG, IgG, IgA, IgM  High risk medication use - Plan: Hepatitis B surface antigen, QuantiFERON-TB Gold Plus, Stratify JCV(TM) Ab w/Index, Hepatitis B surface antibody,qualitative, Hepatitis C antibody, Hepatitis B core antibody, total, Varicella zoster antibody, IgG, IgG, IgA, IgM  Gait disturbance  Insomnia, unspecified type  Anxiety   In summary, Mr. Marquis is a 26 year old man with a recent diagnosis of multiple sclerosis after presenting with numbness, weakness , Ataxia and gait disturbance.  He is presenting in an aggressive manner with at least 5 enhancing lesions in the brain plus multiple nonenhancing lesions and at least 4 enhancing lesion in the spinal cord plus multiple other nonenhancing lesions.  I had a long discussion with him and his mother about treatment options and the goals of therapy.  I am very concerned about his aggressive presentation and feel we need to go straight to one of the more efficacious medications.  In detail, we discussed the risks and benefits of Tysabri and Ocrevus.  I recommend that he go on 1 of these 2 agents based on the results of blood work that we will do today.  He also asked about oral agents and I did discuss the S1 P receptor modulators as they are  probably the most efficacious of the pills.   We also had a long discussion about prognosis.  He is currently relapsing remitting MS.  Having multiple spinal cord lesions is a risk factor of developing secondary progressive MS and that risk is likely higher if he has multiple new lesions over time.  Currently he has recovered a fair amount but is still mildly ataxic and has some paresthesias and bladder issues.  Additionally, he has insomnia.  Due to the diagnosis, he has had more depression and anxiety.  The bladder issues are not severe enough to treat at this time.  I will add trazodone for the insomnia and consider an SSRI if depression and anxiety remain a problem.  He will continue outpatient physical therapy.  He will return to see me in 3 months for regular visit but I will call him next week with the results of the blood work so that we may choose between the disease modifying therapies.  Additionally he is to call us sooner if he has new or worsening neurologic symptoms.  Thank you for asking to see Mr. Gatliff.  Please let me know if I can be of further assistance with him or other patients in the future.   105 minute office visit with the majority of the time spent face-to-face for history and physical, extensive discussion/counseling and decision-making.  Additional time with extensive record/imaging review and documentation.   Kathie Posa A. Felecia Shelling, MD, [facility] 4/50/3888, 2:80 PM Certified in Neurology, Clinical Neurophysiology, Sleep Medicine and Neuroimaging  [facility] [address], [place], [place] [address] [phone]

## 2021-07-29 ENCOUNTER — Other Ambulatory Visit: Payer: Self-pay

## 2021-07-29 ENCOUNTER — Ambulatory Visit: Payer: 59 | Attending: Internal Medicine

## 2021-07-29 ENCOUNTER — Encounter: Payer: Self-pay | Admitting: Neurology

## 2021-07-29 ENCOUNTER — Ambulatory Visit: Payer: 59 | Admitting: Neurology

## 2021-07-29 VITALS — BP 122/81 | HR 97 | Ht 68.0 in | Wt 210.5 lb

## 2021-07-29 DIAGNOSIS — F419 Anxiety disorder, unspecified: Secondary | ICD-10-CM

## 2021-07-29 DIAGNOSIS — R2689 Other abnormalities of gait and mobility: Secondary | ICD-10-CM

## 2021-07-29 DIAGNOSIS — G35 Multiple sclerosis: Secondary | ICD-10-CM

## 2021-07-29 DIAGNOSIS — R269 Unspecified abnormalities of gait and mobility: Secondary | ICD-10-CM | POA: Diagnosis not present

## 2021-07-29 DIAGNOSIS — G4700 Insomnia, unspecified: Secondary | ICD-10-CM | POA: Diagnosis not present

## 2021-07-29 DIAGNOSIS — M6281 Muscle weakness (generalized): Secondary | ICD-10-CM

## 2021-07-29 DIAGNOSIS — Z79899 Other long term (current) drug therapy: Secondary | ICD-10-CM

## 2021-07-29 MED ORDER — TRAZODONE HCL 50 MG PO TABS: 50.0000 mg | ORAL_TABLET | Freq: Every day | ORAL | 5 refills | Status: DC

## 2021-07-29 NOTE — Patient Instructions (Signed)
Access Code: [email or web address] URL: [email or web address] Date: 07/29/2021 Prepared by: Baldomero Lamy  Exercises Side Stepping with Resistance at Thighs - 1 x daily - 5 x weekly - 1 sets - 3-4 reps Forward Monster Walks - 1 x daily - 5 x weekly - 1 sets - 3-4 reps Squat with Chair Touch - 1 x daily - 5 x weekly - 2 sets - 10 reps

## 2021-07-29 NOTE — Therapy (Signed)
[facility] [address] [place], [place], [address] Phone: [phone]   Fax:  [phone]  Physical Therapy Treatment  Patient Details  Name: Willie Archer MRN: [id number] Date of Birth: 1996-07-18 Referring Provider (PT): Candelaria Stagers (hospitalist)  but will follow with Despina Arias so sending referral there.   Encounter Date: 07/29/2021   PT End of Session - 07/29/21 1617     Visit Number 2    Number of Visits 5    Date for PT Re-Evaluation 08/15/21    PT Start Time 1617    PT Stop Time 1702    PT Time Calculation (min) 45 min    Activity Tolerance Patient tolerated treatment well    Behavior During Therapy WFL for tasks assessed/performed             Past Medical History:  Diagnosis Date   Hyperlipidemia    MS (multiple sclerosis) ([facility])    Vitamin D deficiency     Past Surgical History:  Procedure Laterality Date   ACHILLES TENDON SURGERY Bilateral 2007   Dr Cheryll Cockayne    There were no vitals filed for this visit.   Subjective Assessment - 07/29/21 1617     Subjective Patient reports he noticed some tingling in the R Foot today (more noticeable in the R over L). Has not noticed this before. Had initial visit with Neurologist went well. No medications changes.    Pertinent History HLD, B achilles surgery as a child for toe walking    Patient Stated Goals Pt wants to improve his overall fitness.    Currently in Pain? Yes    Pain Score 1     Pain Location Head    Pain Orientation Anterior    Pain Descriptors / Indicators Squeezing    Pain Type Acute pain    Pain Onset 1 to 4 weeks ago    Pain Frequency Intermittent                [facility] PT Assessment - 07/29/21 0001       Ambulation/Gait   Ambulation/Gait Yes    Ambulation/Gait Assistance 6: Modified independent (Device/Increase time)    Assistive device None    Gait Pattern Step-through pattern;Lateral hip instability    Ambulation  Surface Level;Indoor      6 Minute Walk- Baseline   6 Minute Walk- Baseline yes    BP (mmHg) 115/82    HR (bpm) 91    02 Sat (%RA) 98 %    Modified Borg Scale for Dyspnea 0- Nothing at all      6 Minute walk- Post Test   6 Minute Walk Post Test yes    BP (mmHg) 127/87    HR (bpm) 105    02 Sat (%RA) 98 %    Modified Borg Scale for Dyspnea 0- Nothing at all    Perceived Rate of Exertion (Borg) 12-      6 minute walk test results    Aerobic Endurance Distance Walked 1230    Endurance additional comments without AD, maintain good pace and conversation throughout.               Completed all of the following exercises and established Initial HEP consisting of the following. Completed the following with green theraband and provided band for home use.    Access Code: [email or web address] URL: [email or web address] Date: 07/29/2021 Prepared by: Jethro Bastos  Exercises Side Stepping with Resistance at Thighs - 1 x daily -  5 x weekly - 1 sets - 3-4 reps Forward Monster Walks - 1 x daily - 5 x weekly - 1 sets - 3-4 reps Squat with Chair Touch - 1 x daily - 5 x weekly - 2 sets - 10 reps - require cues and demonstration for proper squat form, improved technique with cue to sit back into chair, once light touch return to standing.   Trialed sidelying hip abduction, patient reporting pain in R knee therefore withheld from HEP at this time.   Pt asking questions regarding guidelines equipment in gym and stationary bike at home, PT educating that it would be beneficial to trial these things at next session to determine how patient tolerates said activities before provided guidelines. Patient agreeable.      PT Education - 07/29/21 1703     Education Details Initial HEP    Person(s) Educated Patient    Methods Explanation;Demonstration;Handout    Comprehension Verbalized understanding;Returned demonstration              PT Short Term Goals - 07/18/21 0910       PT  SHORT TERM GOAL #1   Title STGs=LTGs               PT Long Term Goals - 07/29/21 1802       PT LONG TERM GOAL #1   Title Pt will be independent with HEP for strengthening, high level balance and aerobic exercises to continue gains on own. (LTGs due 08/15/21)    Time 4    Period Weeks    Status New    Target Date 08/15/21      PT LONG TERM GOAL #2   Title Pt will increase gait speed to >1.42m/s for improved community mobility.    Baseline 07/17/21 0.4m/s    Time 4    Period Weeks    Status New    Target Date 08/15/21      PT LONG TERM GOAL #3   Title Patient will be able to ambulate >/= 1400 ft in to demo improved endurance    Baseline 1230 ft    Time 4    Period Weeks    Status Revised    Target Date 08/15/21      PT LONG TERM GOAL #4   Title Pt will be able to tolerate >10 min of moderate intensity activity to simulate work tasks as well with <3/10 RPE for improved activity tolerance.    Time 4    Period Weeks    Status New    Target Date 08/15/21                   Plan - 07/29/21 1627     Clinical Impression Statement Today's skilled PT session focused on endurance assesment with , patient able to ambulate x 1230 ft with normal vital response. Mild fatigue noted. Rest of session focused on establishing initial HEP for BLE (RLE > LLE) strengthening. Pt tolerating all activities well. Will continue per POC.    Personal Factors and Comorbidities Comorbidity 2    Comorbidities HLD, B achilles surgery as a child for toe walking    Examination-Activity Limitations Locomotion Level    Examination-Participation Restrictions Occupation;Community Activity    Stability/Clinical Decision Making Evolving/Moderate complexity    Rehab Potential Good    PT Frequency 1x / week   plus eval   PT Duration 4 weeks    PT Treatment/Interventions ADLs/Self Care Home Management;Neuromuscular re-education;Functional mobility training;Gait  training;Stair  training;Therapeutic activities;Therapeutic exercise;Manual techniques;Energy conservation    PT Next Visit Plan Continue BLE strengthening. Add to HEP as needed. focus more on right as grossly 4/5 on right, functional strengthening. High level dynamic gait and balance activities. 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 [place], [place], [address] Phone: [phone]   Fax:  [phone]  Name: TAJIR NESBITT MRN: [id number] Date of Birth: June 15, 1996

## 2021-07-31 ENCOUNTER — Other Ambulatory Visit (HOSPITAL_COMMUNITY): Payer: Self-pay

## 2021-08-05 ENCOUNTER — Encounter: Payer: Self-pay | Admitting: Neurology

## 2021-08-08 ENCOUNTER — Ambulatory Visit: Payer: 59

## 2021-08-09 ENCOUNTER — Other Ambulatory Visit: Payer: Self-pay

## 2021-08-09 ENCOUNTER — Inpatient Hospital Stay (HOSPITAL_COMMUNITY)
Admission: EM | Admit: 2021-08-09 | Discharge: 2021-08-10 | DRG: 059 | Disposition: A | Discharge status: Home / Self Care | Payer: 59 | Attending: Internal Medicine | Admitting: Internal Medicine

## 2021-08-09 DIAGNOSIS — G35 Multiple sclerosis: Secondary | ICD-10-CM | POA: Diagnosis not present

## 2021-08-09 DIAGNOSIS — Z6831 Body mass index (BMI) 31.0-31.9, adult: Secondary | ICD-10-CM | POA: Diagnosis not present

## 2021-08-09 DIAGNOSIS — R202 Paresthesia of skin: Secondary | ICD-10-CM | POA: Diagnosis present

## 2021-08-09 DIAGNOSIS — Z79891 Long term (current) use of opiate analgesic: Secondary | ICD-10-CM | POA: Diagnosis not present

## 2021-08-09 DIAGNOSIS — H5462 Unqualified visual loss, left eye, normal vision right eye: Secondary | ICD-10-CM | POA: Diagnosis not present

## 2021-08-09 DIAGNOSIS — E669 Obesity, unspecified: Secondary | ICD-10-CM | POA: Diagnosis not present

## 2021-08-09 DIAGNOSIS — E876 Hypokalemia: Secondary | ICD-10-CM | POA: Diagnosis present

## 2021-08-09 DIAGNOSIS — Z79899 Other long term (current) drug therapy: Secondary | ICD-10-CM

## 2021-08-09 DIAGNOSIS — E785 Hyperlipidemia, unspecified: Secondary | ICD-10-CM | POA: Diagnosis not present

## 2021-08-09 DIAGNOSIS — H469 Unspecified optic neuritis: Secondary | ICD-10-CM | POA: Diagnosis not present

## 2021-08-09 DIAGNOSIS — G589 Mononeuropathy, unspecified: Secondary | ICD-10-CM | POA: Diagnosis not present

## 2021-08-09 DIAGNOSIS — E559 Vitamin D deficiency, unspecified: Secondary | ICD-10-CM | POA: Diagnosis present

## 2021-08-09 DIAGNOSIS — Z20822 Contact with and (suspected) exposure to covid-19: Secondary | ICD-10-CM | POA: Diagnosis present

## 2021-08-09 DIAGNOSIS — H47092 Other disorders of optic nerve, not elsewhere classified, left eye: Secondary | ICD-10-CM | POA: Diagnosis not present

## 2021-08-09 NOTE — ED Triage Notes (Signed)
Pt states he has blurred vision in his left eye. Pt states he is a MS patient and he called his provider who told him to come to the ED to get a steroid treatment.

## 2021-08-10 ENCOUNTER — Emergency Department (HOSPITAL_COMMUNITY): Payer: 59

## 2021-08-10 ENCOUNTER — Telehealth: Payer: Self-pay | Admitting: Neurology

## 2021-08-10 DIAGNOSIS — Z6831 Body mass index (BMI) 31.0-31.9, adult: Secondary | ICD-10-CM | POA: Diagnosis not present

## 2021-08-10 DIAGNOSIS — H469 Unspecified optic neuritis: Secondary | ICD-10-CM

## 2021-08-10 DIAGNOSIS — E669 Obesity, unspecified: Secondary | ICD-10-CM

## 2021-08-10 DIAGNOSIS — G35 Multiple sclerosis: Secondary | ICD-10-CM | POA: Diagnosis not present

## 2021-08-10 DIAGNOSIS — Z79899 Other long term (current) drug therapy: Secondary | ICD-10-CM | POA: Diagnosis not present

## 2021-08-10 DIAGNOSIS — E785 Hyperlipidemia, unspecified: Secondary | ICD-10-CM | POA: Diagnosis not present

## 2021-08-10 DIAGNOSIS — E559 Vitamin D deficiency, unspecified: Secondary | ICD-10-CM | POA: Diagnosis present

## 2021-08-10 DIAGNOSIS — R202 Paresthesia of skin: Secondary | ICD-10-CM | POA: Diagnosis present

## 2021-08-10 DIAGNOSIS — Z79891 Long term (current) use of opiate analgesic: Secondary | ICD-10-CM | POA: Diagnosis not present

## 2021-08-10 DIAGNOSIS — E876 Hypokalemia: Secondary | ICD-10-CM | POA: Diagnosis not present

## 2021-08-10 DIAGNOSIS — H5462 Unqualified visual loss, left eye, normal vision right eye: Secondary | ICD-10-CM | POA: Diagnosis present

## 2021-08-10 DIAGNOSIS — Z20822 Contact with and (suspected) exposure to covid-19: Secondary | ICD-10-CM | POA: Diagnosis present

## 2021-08-10 LAB — BASIC METABOLIC PANEL
Anion gap: 11 (ref 5–15)
BUN: 14 mg/dL (ref 6–20)
CO2: 25 mmol/L (ref 22–32)
Calcium: 9.4 mg/dL (ref 8.9–10.3)
Chloride: 103 mmol/L (ref 98–111)
Creatinine, Ser: 0.89 mg/dL (ref 0.61–1.24)
GFR, Estimated: >60 mL/min (ref >60)
Glucose, Bld: 80 mg/dL (ref 70–99)
Potassium: 3.2 mmol/L — ABNORMAL LOW (ref 3.5–5.1)
Sodium: 139 mmol/L (ref 135–145)

## 2021-08-10 LAB — CBC WITH DIFFERENTIAL/PLATELET
Abs Immature Granulocytes: 0.03 10*3/uL (ref 0.00–0.07)
Basophils Absolute: 0 10*3/uL (ref 0.0–0.1)
Basophils Relative: 0 %
Eosinophils Absolute: 0.1 10*3/uL (ref 0.0–0.5)
Eosinophils Relative: 1 %
HCT: 42.2 % (ref 39.0–52.0)
Hemoglobin: 14.1 g/dL (ref 13.0–17.0)
Immature Granulocytes: 0 %
Lymphocytes Relative: 37 %
Lymphs Abs: 3.3 10*3/uL (ref 0.7–4.0)
MCH: 26.9 pg (ref 26.0–34.0)
MCHC: 33.4 g/dL (ref 30.0–36.0)
MCV: 80.5 fL (ref 80.0–100.0)
Monocytes Absolute: 0.9 10*3/uL (ref 0.1–1.0)
Monocytes Relative: 10 %
Neutro Abs: 4.5 10*3/uL (ref 1.7–7.7)
Neutrophils Relative %: 52 %
Platelets: 356 10*3/uL (ref 150–400)
RBC: 5.24 MIL/uL (ref 4.22–5.81)
RDW: 13.5 % (ref 11.5–15.5)
WBC: 8.8 10*3/uL (ref 4.0–10.5)
nRBC: 0 % (ref 0.0–0.2)

## 2021-08-10 LAB — RESP PANEL BY RT-PCR (FLU A&B, COVID) ARPGX2
Influenza A by PCR: NEGATIVE
Influenza B by PCR: NEGATIVE
SARS Coronavirus 2 by RT PCR: NEGATIVE

## 2021-08-10 IMAGING — MR MR HEAD WO/W CM
14 of 17 series · 39 of 48 positions shown · IV contrast (gadavist)
Comparison: Brain MRI [DATE].

Cervical and thoracic MRI today reported separately.

CLINICAL DATA: 25-year-old male with demyelinating disease on MRI
brain and spine last month. Multiple sclerosis.

EXAM:
MRI HEAD AND ORBITS WITHOUT AND WITH CONTRAST
TECHNIQUE: Multiplanar, multiecho pulse sequences of the brain and surrounding
structures were obtained without and with intravenous contrast.
Multiplanar, multiecho pulse sequences of the orbits and surrounding
structures were obtained including fat saturation techniques, before
and after intravenous contrast administration.
CONTRAST:  10mL GADAVIST GADOBUTROL 1 MMOL/ML IV SOLN

[Series 9: DWI · axial · 3.0mm · 0.88mm/px · z∈[-84,+68]mm · 6 of 104 slices shown (1 of 4)]
[im 1/104]
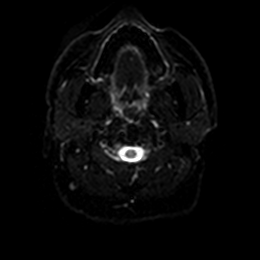
[im 21/104]
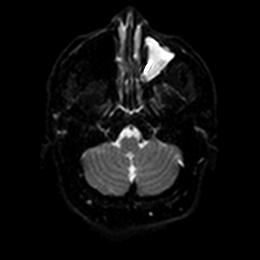
[im 42/104]
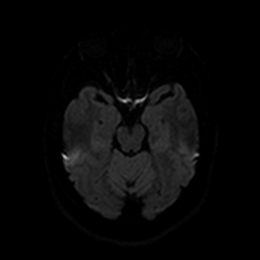
[im 62/104]
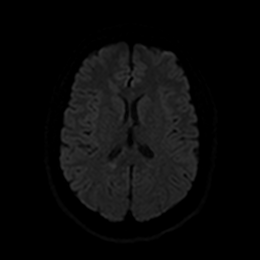
[im 83/104]
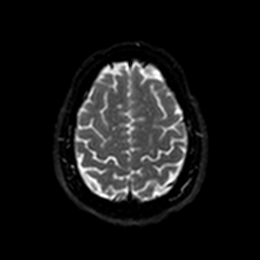
[im 104/104]
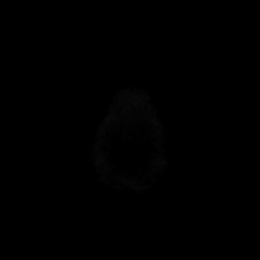

[Series 10: DWI · axial · 3.0mm · 0.88mm/px · z∈[-84,+68]mm · 3 of 51 slices shown (2 of 4)]
[im 1/51]
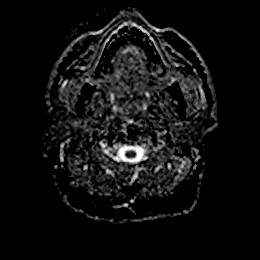
[im 26/51]
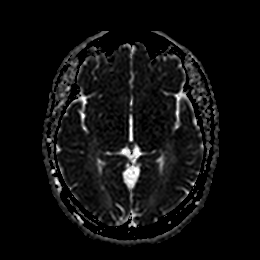
[im 51/51]
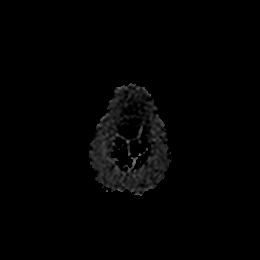

[Series 11: DWI · coronal · 4.0mm · 0.88mm/px · 4 of 70 slices shown (3 of 4)]
[im 1/70]
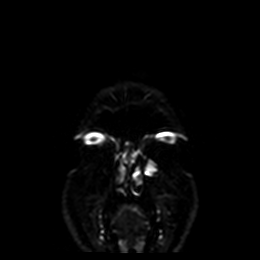
[im 24/70]
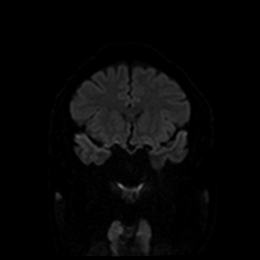
[im 47/70]
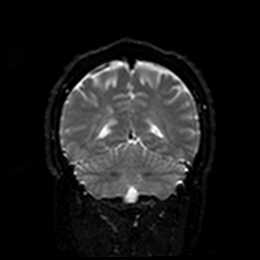
[im 70/70]
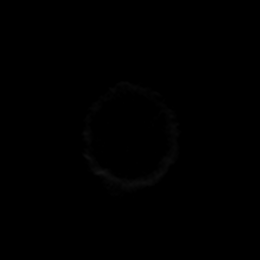

[Series 12: DWI · coronal · 4.0mm · 0.88mm/px · 2 of 35 slices shown (4 of 4)]
[im 1/35]
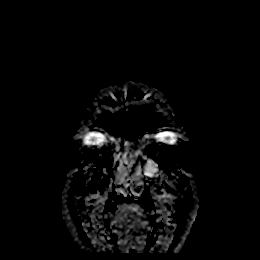
[im 35/35]
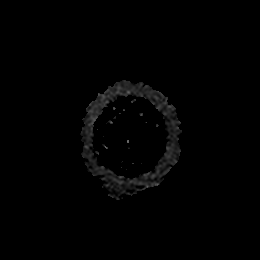

[Series 13: T1 · sagittal · 5.0mm · 0.75mm/px · 2 of 25 slices shown]
[im 1/25]
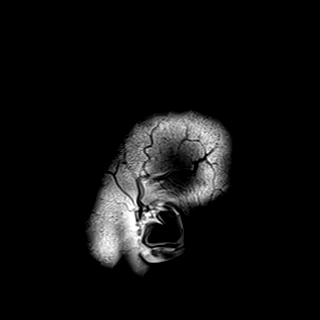
[im 25/25]
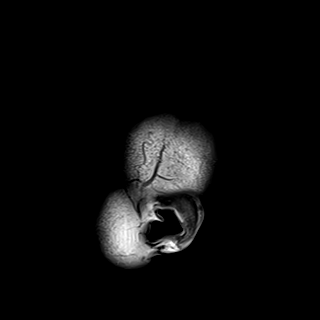

[Series 14: T2 · axial · 5.0mm · 0.72mm/px · z∈[-80,+63]mm · 2 of 25 slices shown]
[im 1/25]
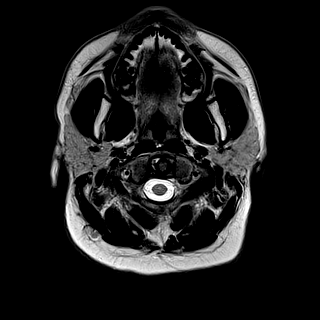
[im 25/25]
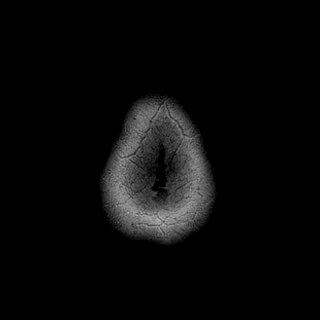

[Series 15: FLAIR · axial · 5.0mm · 0.45mm/px · z∈[-78,+65]mm · 2 of 25 slices shown]
[im 1/25]
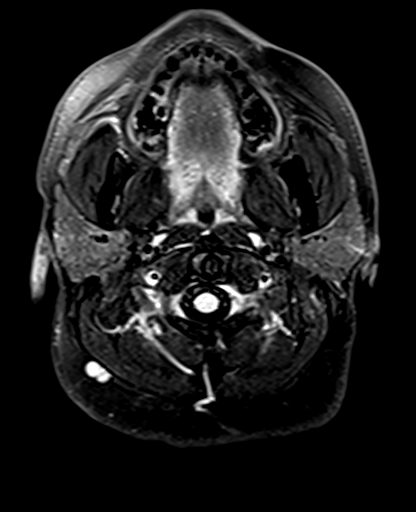
[im 25/25]
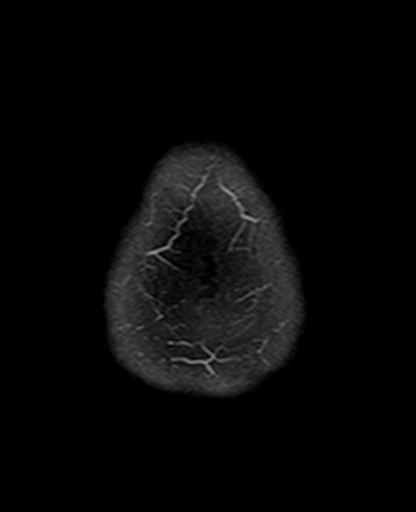

[Series 17: mag_images · axial · 3.0mm · 0.90mm/px · z∈[-79,+73]mm · 3 of 52 slices shown]
[im 1/52]
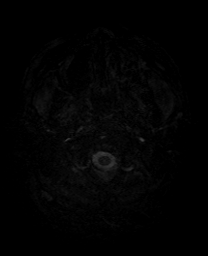
[im 26/52]
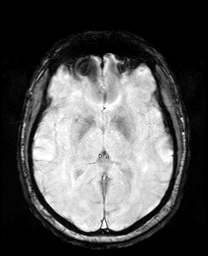
[im 52/52]
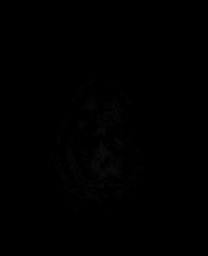

[Series 18: pha_images · axial · 3.0mm · 0.90mm/px · z∈[-79,+73]mm · 3 of 52 slices shown]
[im 1/52]
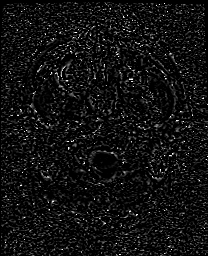
[im 26/52]
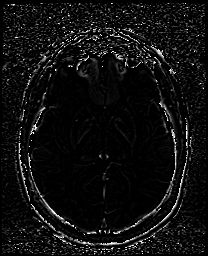
[im 52/52]
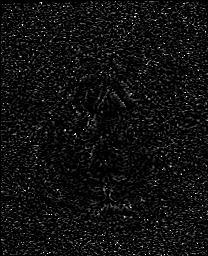

[Series 19: swi_images · axial · 3.0mm · 0.90mm/px · z∈[-79,+73]mm · 3 of 52 slices shown]
[im 1/52]
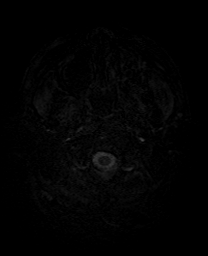
[im 26/52]
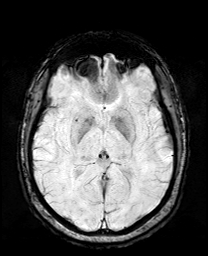
[im 52/52]
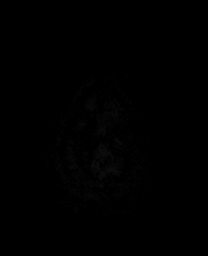

[Series 20: mip_images(sw) · axial · 24.0mm · 0.90mm/px · z∈[-69,+62]mm · 3 of 45 slices shown]
[im 1/45]
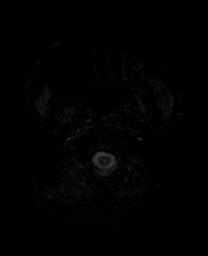
[im 23/45]
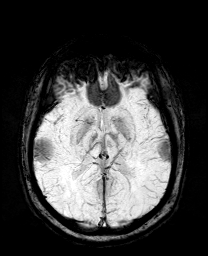
[im 45/45]
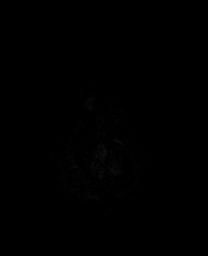

[Series 26: T1 post-contrast · sagittal · 5.0mm · 0.72mm/px · 2 of 25 slices shown (1 of 2)]
[im 1/25]
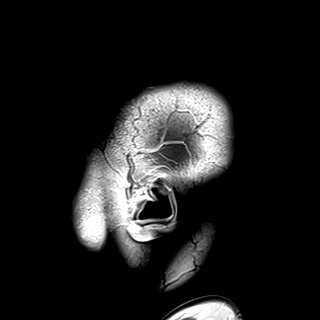
[im 25/25]
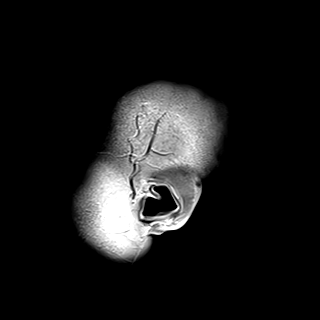

[Series 28: T2 post-contrast · coronal · 5.0mm · 0.72mm/px · 2 of 30 slices shown]
[im 1/30]
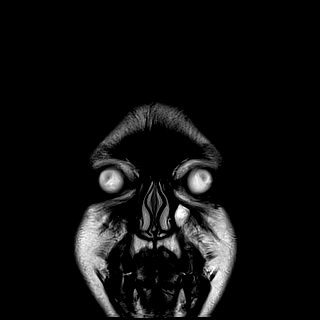
[im 30/30]
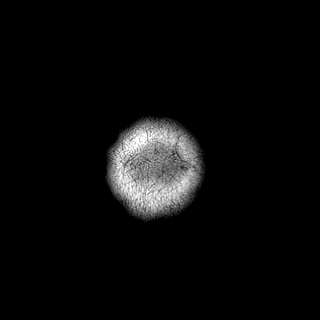

[Series 29: T1 post-contrast · coronal · 5.0mm · 0.34mm/px · 2 of 30 slices shown (2 of 2)]
[im 1/30]
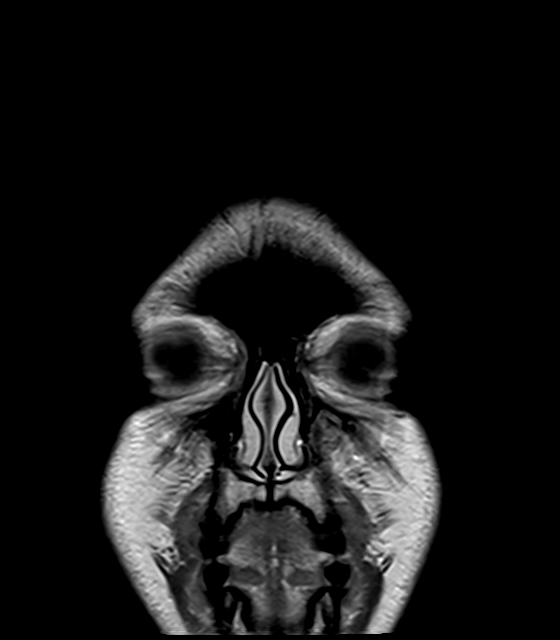
[im 30/30]
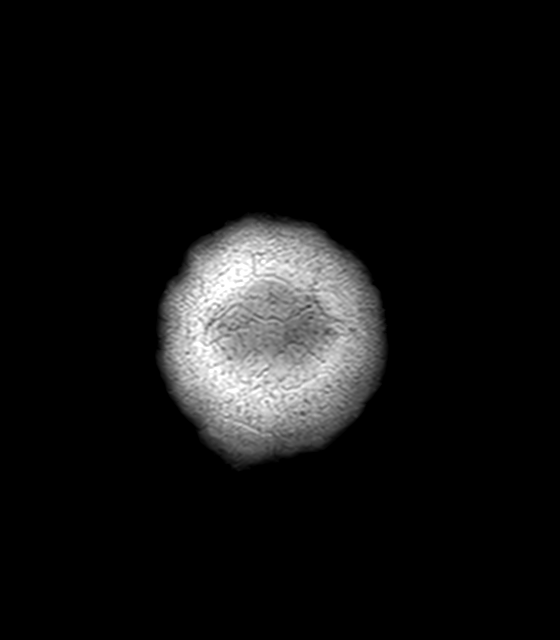

[39 of 48 positions shown; findings below may reference images not displayed]

FINDINGS: MRI HEAD FINDINGS

Brain: Of the multiple scattered small foci of abnormal enhancement
seen on MRI last month, small lesions in the anterior corona radiata
(series 27, image 35 today), near the posterior right body of the
corpus callosum (same image), along the right parietooccipital
sulcus persist. The left cerebellar enhancement has resolved.

But there are multiple new small foci of abnormal enhancement
scattered in both cerebral hemispheres (series 27), with
approximately eleven new areas of involvement identified (including
2 in the cerebellum which are better demonstrated on the dedicated
orbit images, series 20).

Associated abnormal DWI in those areas. T2 and FLAIR hyperintensity
but no vasogenic edema or mass effect.

Underlying extensive bilateral periventricular white matter T2 and
FLAIR hyperintensity elsewhere has not significantly changed.

Involvement of the posterior limbs left deep white matter capsules
and left lateral thalamus as before. Involvement of the left
cerebellar peduncle, right lateral pons. And small scattered
involvement of the cerebellar hemispheres.

No superimposed restricted diffusion suggestive of acute infarction.
No dural thickening. No midline shift, mass effect, evidence of mass
lesion, ventriculomegaly, extra-axial collection or acute
intracranial hemorrhage. Cervicomedullary junction and pituitary are
within normal limits. No chronic cerebral blood products. No
definite cortical encephalomalacia.

Vascular: Major intracranial vascular flow voids are stable. The
major dural venous sinuses are enhancing and appear to be patent.

Skull and upper cervical spine: Dedicated cervical spine MRI today
reported separately. Visualized bone marrow signal is within normal
limits.

Other: Mastoid air cells remain clear. Visible internal auditory
structures appear normal.

MRI ORBITS FINDINGS

Orbits: Normal suprasellar cistern, but there is asymmetric T2
hyperintensity in the left lateral optic chiasm (series 17, image
6). No enhancement there, but the prechiasmatic left optic nerve is
enlarged, T2 hyperintense, and intensely enhancing intermittently
along its course to the globe. See series 17, image 12, series 19,
images 9 and 13.

Contralateral right optic nerve remains within normal limits.

Cavernous sinuses are within normal limits. Globes are intact. No
other abnormal intraorbital enhancement or inflammation.

Visualized sinuses: Ongoing left maxillary sinus inflammation with
mucosal thickening and trapped secretions. Other Visualized
paranasal sinuses and mastoids are stable and well aerated.

Soft tissues: Negative visible noncontrast deep soft tissue spaces
of the face.
IMPRESSION: 1. Left side Acute Optic Neuritis, and widespread small foci of
Acute Demyelination scattered in both cerebral and cerebellar
hemispheres.
Some of the active demyelination in [REDACTED] is now quiescent, but
approximately 11 new foci of enhancing demyelination are identified
today.

2. No associated vasogenic edema or intracranial mass effect.

3. Dedicated spine MRI today reported separately.

## 2021-08-10 IMAGING — MR MR CERVICAL SPINE WO/W CM
7 of 8 series · 29 of 48 positions shown · IV contrast (gadavist)
Comparison: MRI cervical and thoracic spine [DATE]

CLINICAL DATA: Multiple sclerosis.

EXAM:
MRI CERVICAL AND THORACIC SPINE WITHOUT AND WITH CONTRAST
TECHNIQUE: Multiplanar and multiecho pulse sequences of the cervical spine, to
include the craniocervical junction and cervicothoracic junction,
and the thoracic spine, were obtained without and with intravenous
contrast.
CONTRAST:  10mL GADAVIST GADOBUTROL 1 MMOL/ML IV SOLN

[Series 15: T2 · sagittal · 3.0mm · 0.69mm/px · 2 of 15 slices shown (1 of 2)]
[im 1/15]
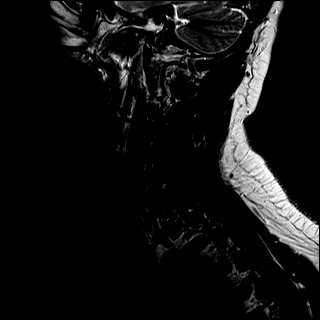
[im 15/15]
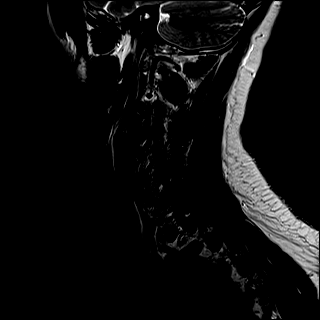

[Series 16: T1 · sagittal · 3.0mm · 0.69mm/px · 3 of 15 slices shown (1 of 2)]
[im 1/15]
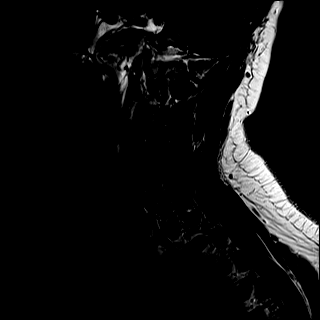
[im 8/15]
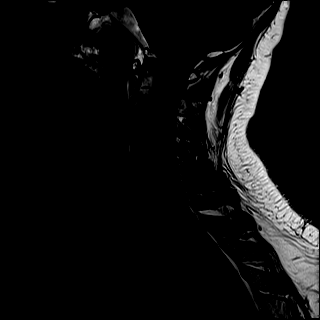
[im 15/15]
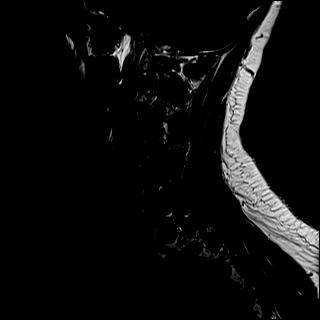

[Series 17: STIR · sagittal · 3.0mm · 0.86mm/px · 3 of 15 slices shown]
[im 1/15]
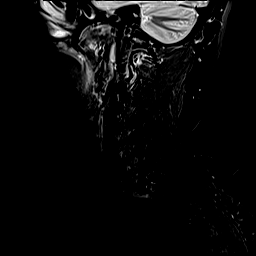
[im 8/15]
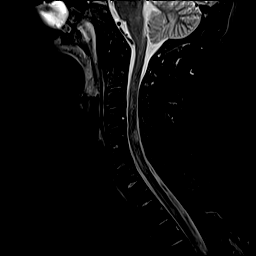
[im 15/15]
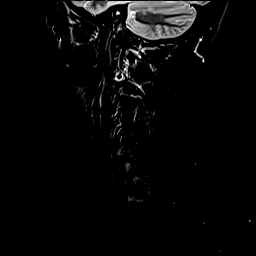

[Series 18: T2 · axial · 3.0mm · 0.66mm/px · z∈[-130,+29]mm · 9 of 48 slices shown (2 of 2)]
[im 1/48]
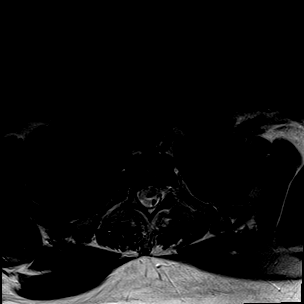
[im 6/48]
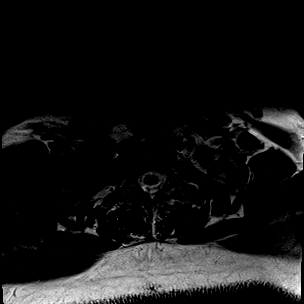
[im 12/48]
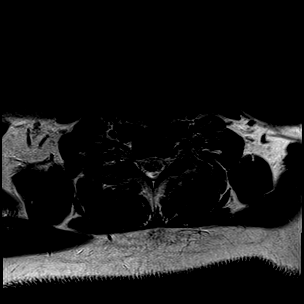
[im 18/48]
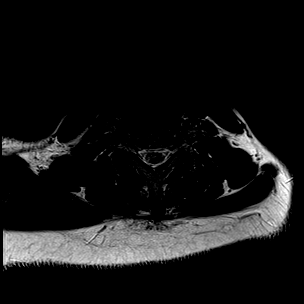
[im 24/48]
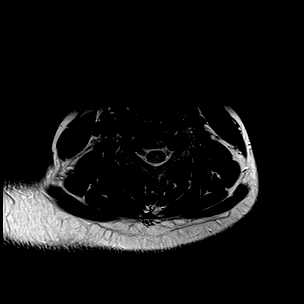
[im 30/48]
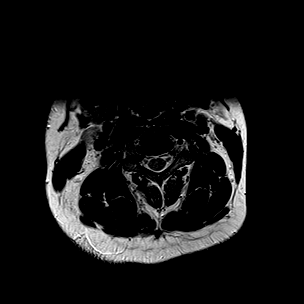
[im 36/48]
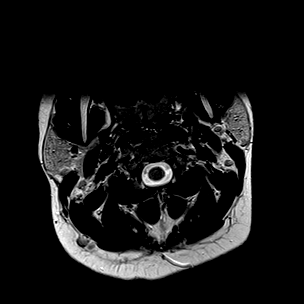
[im 42/48]
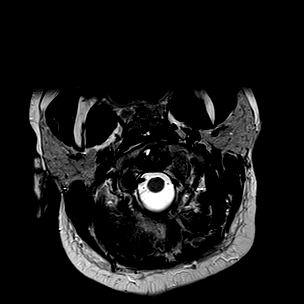
[im 48/48]
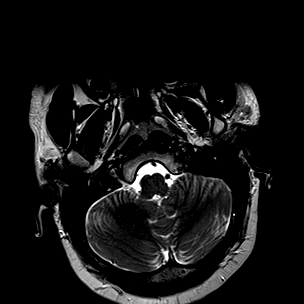

[Series 20: T1 · axial · 3.0mm · 0.39mm/px · z∈[-114,+13]mm · 8 of 40 slices shown (2 of 2)]
[im 1/40]
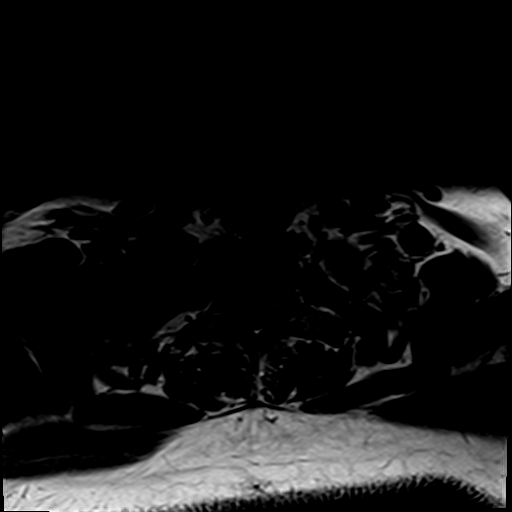
[im 6/40]
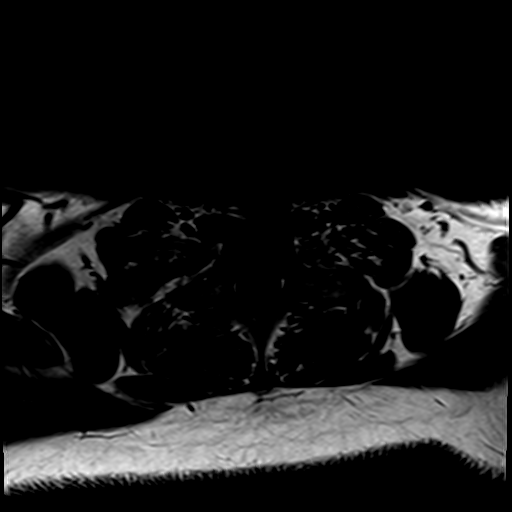
[im 12/40]
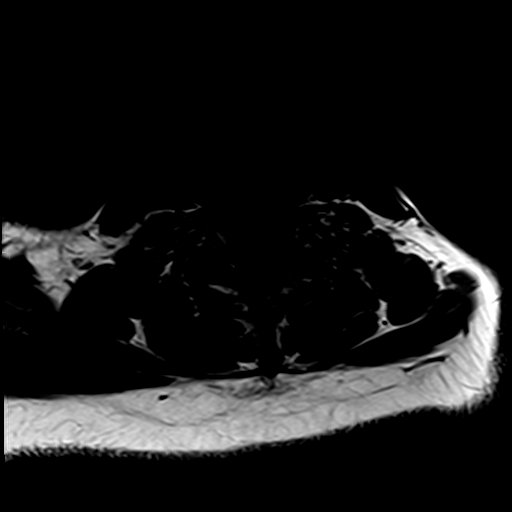
[im 17/40]
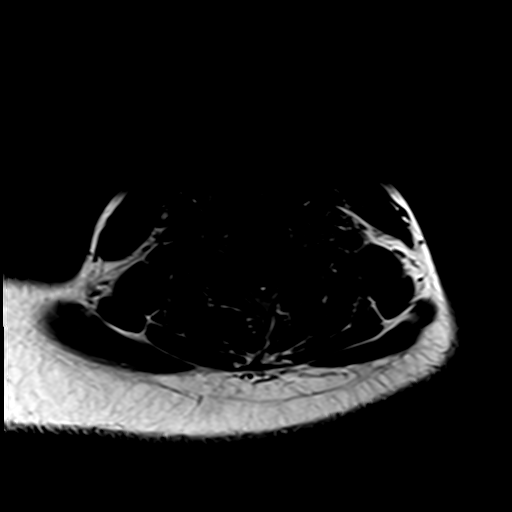
[im 23/40]
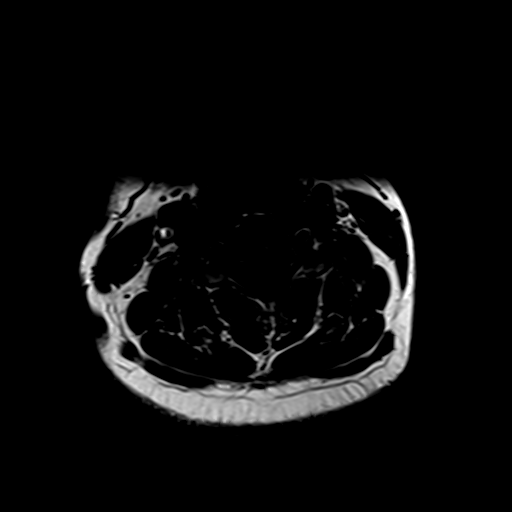
[im 28/40]
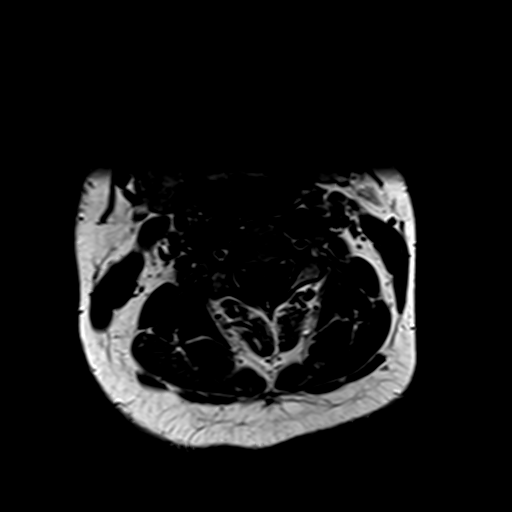
[im 34/40]
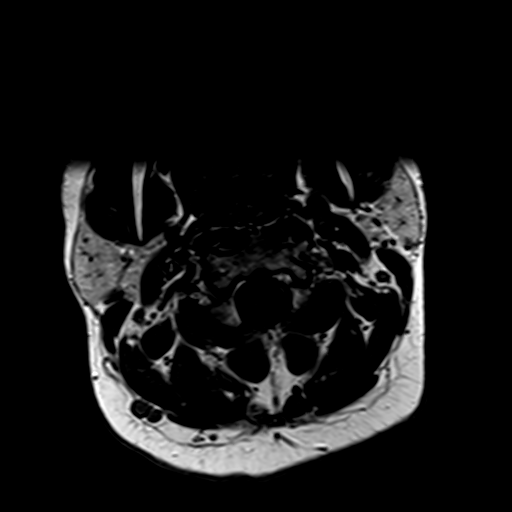
[im 40/40]
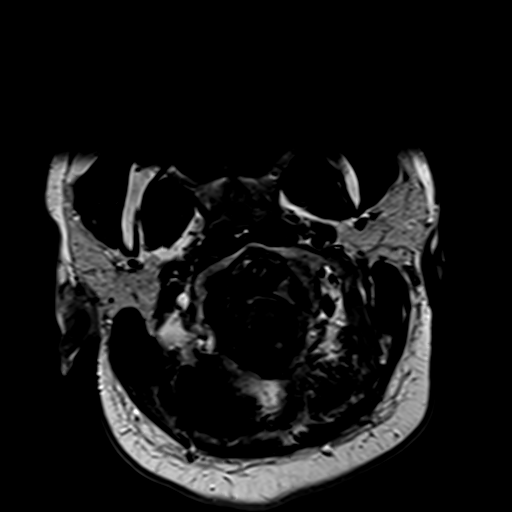

[Series 21: T1 fat-sat post-contrast · sagittal · 3.0mm · 0.43mm/px · 3 of 15 slices shown]
[im 1/15]
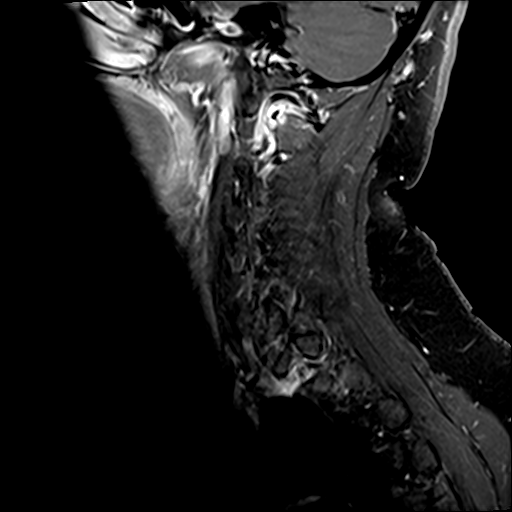
[im 8/15]
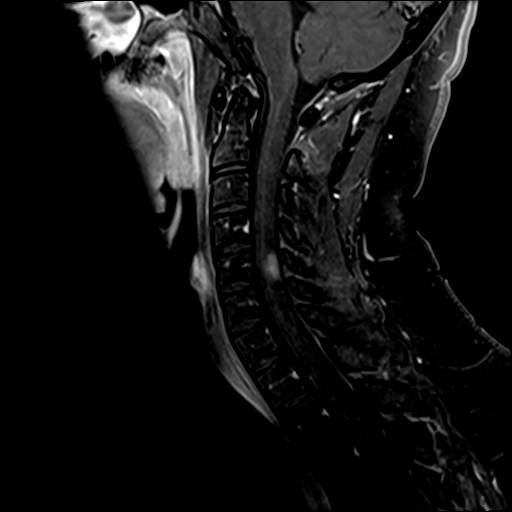
[im 15/15]
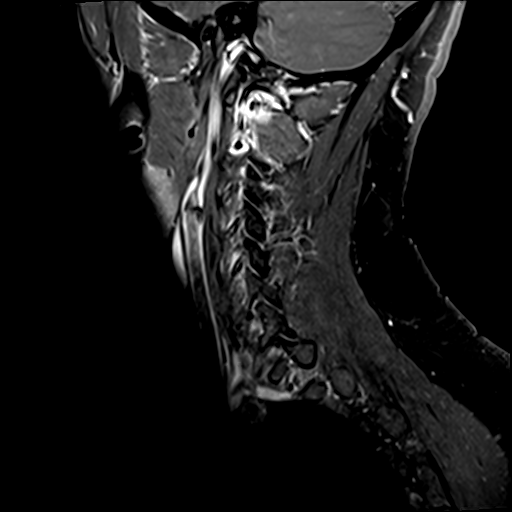

[Series 22: T1 post-contrast · axial · 3.0mm · 0.39mm/px · 1 of 50 slices shown]
[im 1/50]
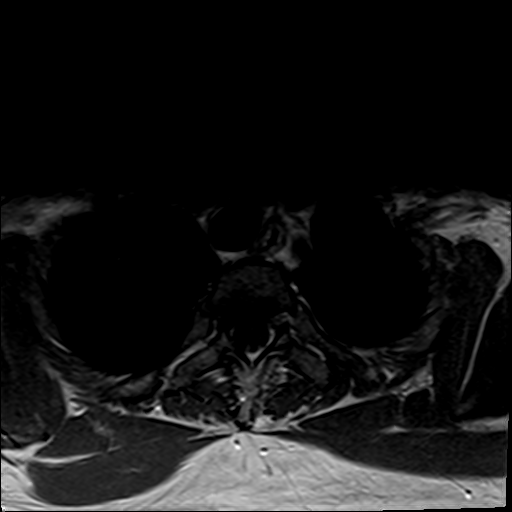

[29 of 48 positions shown; findings below may reference images not displayed]

FINDINGS: MRI CERVICAL SPINE FINDINGS

Alignment: Normal

Vertebrae: Normal bone marrow.  Negative for fracture or mass

Cord: Multiple cord lesions compatible with multiple sclerosis.

Mild improvement in cord lesions at C2 and C3. Improvement in the
lesion at C6-7 which is less apparent on STIR and no longer shows
enhancement

New lesion at the C5 level centrally located and with diffuse
enhancement.

Posterior Fossa, vertebral arteries, paraspinal tissues: Negative

Disc levels:

Normal disc spaces.  No spinal stenosis.

MRI THORACIC SPINE FINDINGS

Alignment:  Normal

Vertebrae: Normal bone marrow.  Negative for fracture or mass.

Cord: Multiple cord lesions are present consistent with multiple
sclerosis. Lesions are less apparent on STIR. Interval resolution of
cord enhancement in the T2-3 level. Mild enhancement at the T9 level
also has resolved. No new enhancing lesion identified. Resolution of
mild enhancement in the T11 lesion.

Paraspinal and other soft tissues: Negative

Disc levels:

Normal
IMPRESSION: 1. Mixed response in the cervical spinal cord. Improvement is
several lesions however there is a new enhancing lesion at C5-6
compatible with active demyelinization
2. Improved lesions in thoracic cord. Lesions are less apparent with
resolution of enhancing lesions at T2-T3, T9, and T11.

## 2021-08-10 IMAGING — MR MR ORBITS WO/W CM
7 of 10 series · 32 of 48 positions shown · IV contrast (gadavist)
Comparison: Brain MRI [DATE].

Cervical and thoracic MRI today reported separately.

CLINICAL DATA: 25-year-old male with demyelinating disease on MRI
brain and spine last month. Multiple sclerosis.

EXAM:
MRI HEAD AND ORBITS WITHOUT AND WITH CONTRAST
TECHNIQUE: Multiplanar, multiecho pulse sequences of the brain and surrounding
structures were obtained without and with intravenous contrast.
Multiplanar, multiecho pulse sequences of the orbits and surrounding
structures were obtained including fat saturation techniques, before
and after intravenous contrast administration.
CONTRAST:  10mL GADAVIST GADOBUTROL 1 MMOL/ML IV SOLN

[Series 11: T1 · axial · non-contrast · 3.0mm · 0.37mm/px · z∈[-79,-45]mm · 2 of 25 slices shown]
[im 1/25]
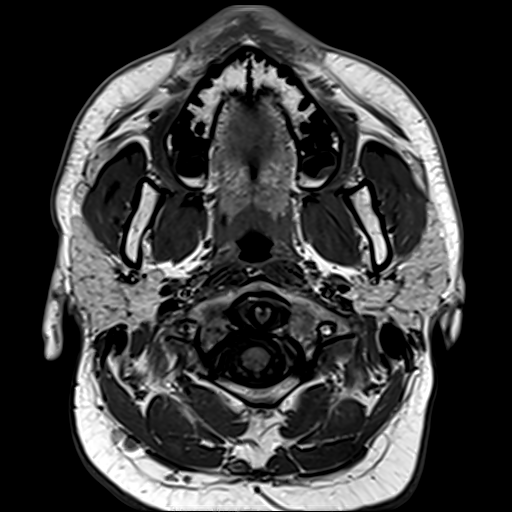
[im 9/25]
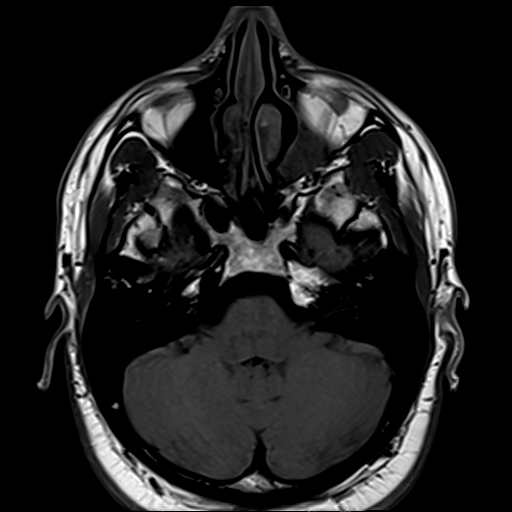

[Series 12: T2 fat-sat · axial · 3.0mm · 0.54mm/px · z∈[-82,+18]mm · 5 of 25 slices shown (1 of 6)]
[im 1/25]
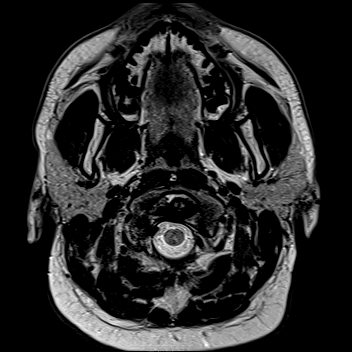
[im 7/25]
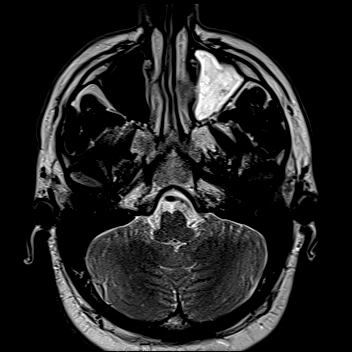
[im 13/25]
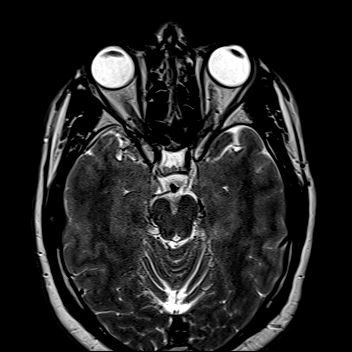
[im 19/25]
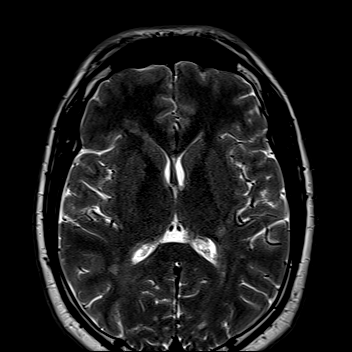
[im 25/25]
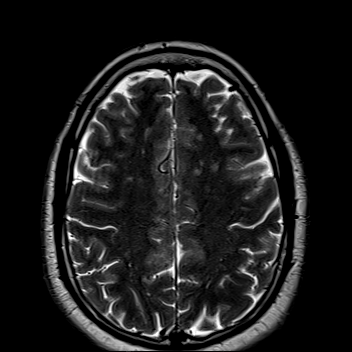

[Series 13: T2 fat-sat · axial · 3.0mm · 0.54mm/px · z∈[-82,+18]mm · 5 of 25 slices shown (2 of 6)]
[im 1/25]
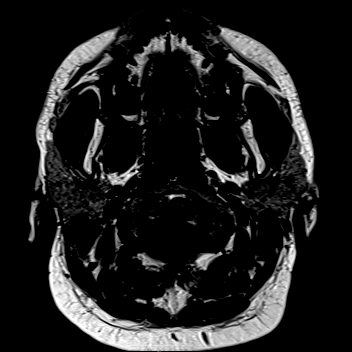
[im 7/25]
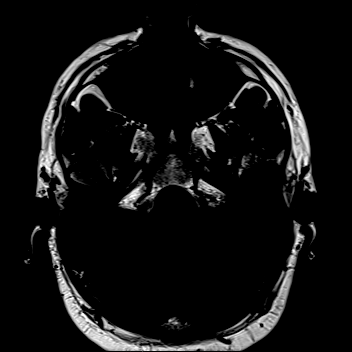
[im 13/25]
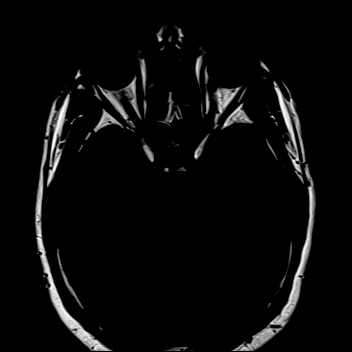
[im 19/25]
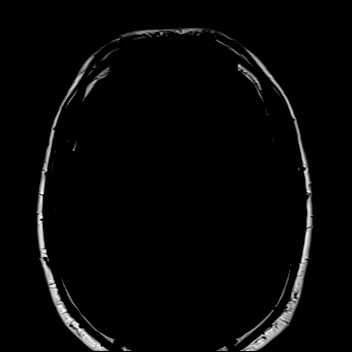
[im 25/25]
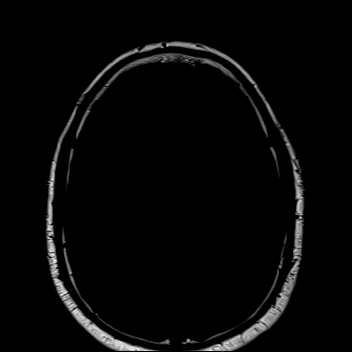

[Series 14: T2 fat-sat · axial · 3.0mm · 0.54mm/px · z∈[-82,+18]mm · 5 of 25 slices shown (3 of 6)]
[im 1/25]
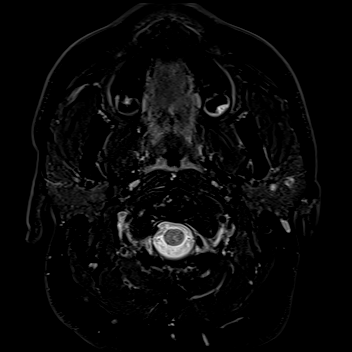
[im 7/25]
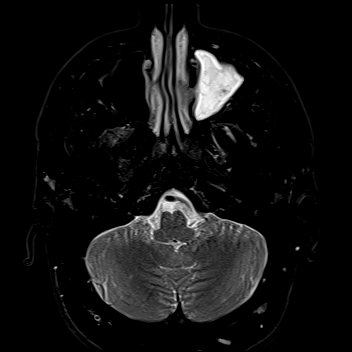
[im 13/25]
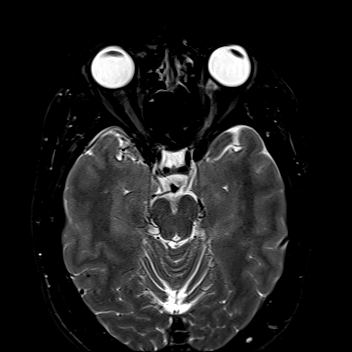
[im 19/25]
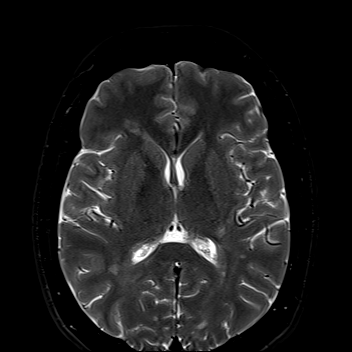
[im 25/25]
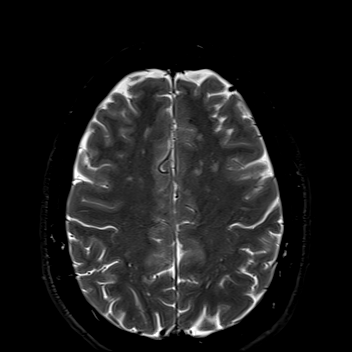

[Series 15: T2 fat-sat · coronal · 3.0mm · 0.54mm/px · 5 of 25 slices shown (4 of 6)]
[im 1/25]
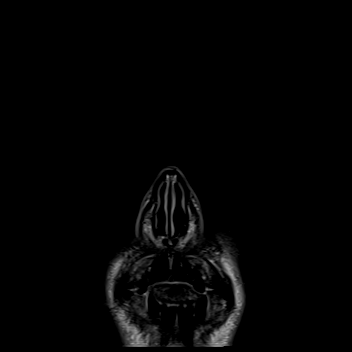
[im 7/25]
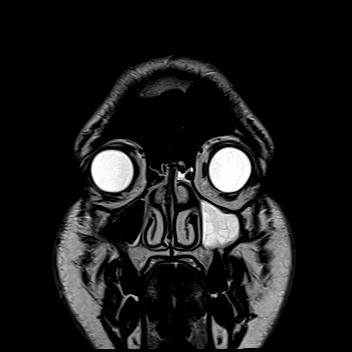
[im 13/25]
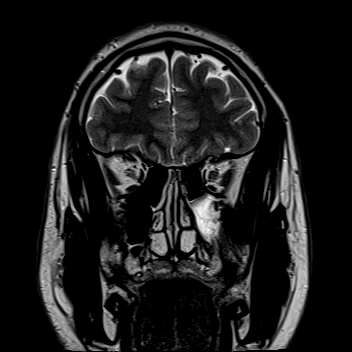
[im 19/25]
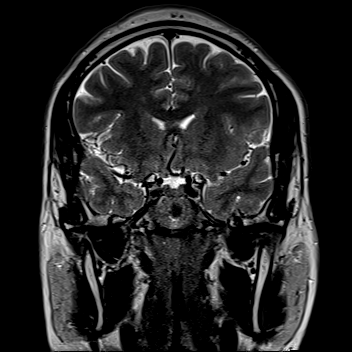
[im 25/25]
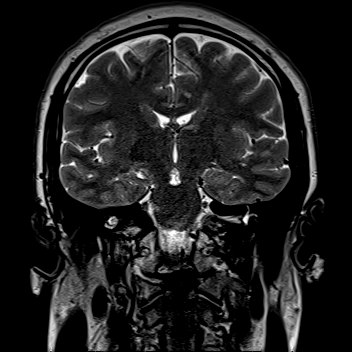

[Series 16: T2 fat-sat · coronal · 3.0mm · 0.54mm/px · 5 of 25 slices shown (5 of 6)]
[im 1/25]
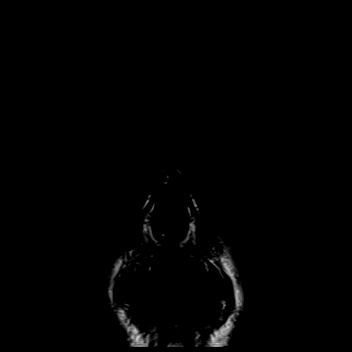
[im 7/25]
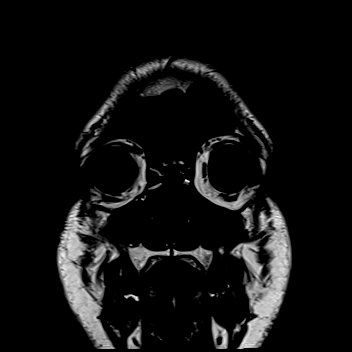
[im 13/25]
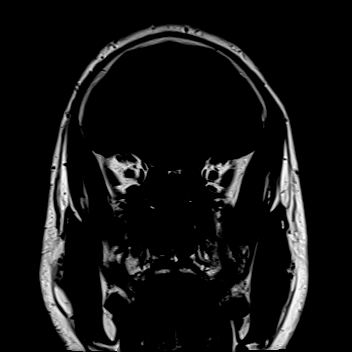
[im 19/25]
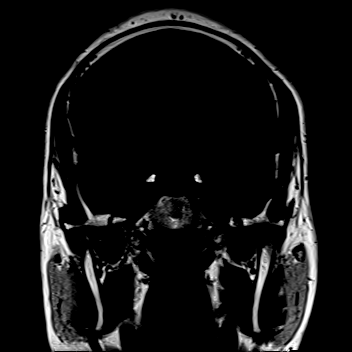
[im 25/25]
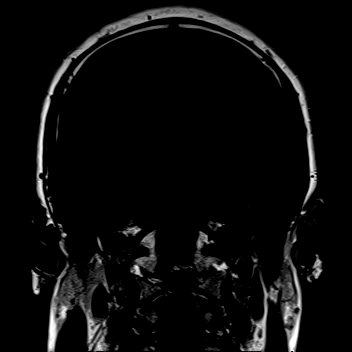

[Series 17: T2 fat-sat · coronal · 3.0mm · 0.54mm/px · 5 of 25 slices shown (6 of 6)]
[im 1/25]
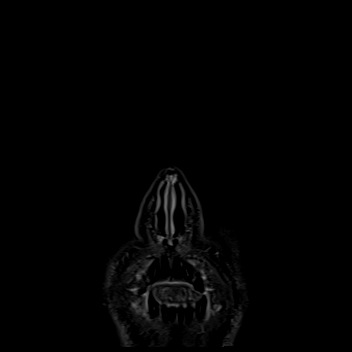
[im 7/25]
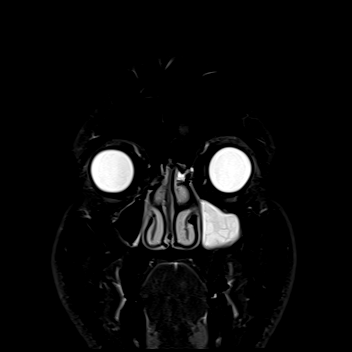
[im 13/25]
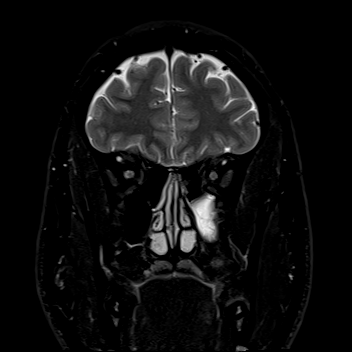
[im 19/25]
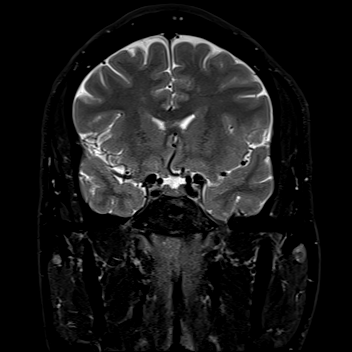
[im 25/25]
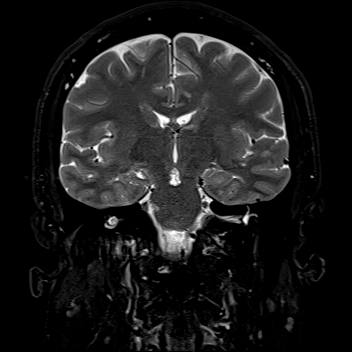

[32 of 48 positions shown; findings below may reference images not displayed]

FINDINGS: MRI HEAD FINDINGS

Brain: Of the multiple scattered small foci of abnormal enhancement
seen on MRI last month, small lesions in the anterior corona radiata
(series 27, image 35 today), near the posterior right body of the
corpus callosum (same image), along the right parietooccipital
sulcus persist. The left cerebellar enhancement has resolved.

But there are multiple new small foci of abnormal enhancement
scattered in both cerebral hemispheres (series 27), with
approximately eleven new areas of involvement identified (including
2 in the cerebellum which are better demonstrated on the dedicated
orbit images, series 20).

Associated abnormal DWI in those areas. T2 and FLAIR hyperintensity
but no vasogenic edema or mass effect.

Underlying extensive bilateral periventricular white matter T2 and
FLAIR hyperintensity elsewhere has not significantly changed.

Involvement of the posterior limbs left deep white matter capsules
and left lateral thalamus as before. Involvement of the left
cerebellar peduncle, right lateral pons. And small scattered
involvement of the cerebellar hemispheres.

No superimposed restricted diffusion suggestive of acute infarction.
No dural thickening. No midline shift, mass effect, evidence of mass
lesion, ventriculomegaly, extra-axial collection or acute
intracranial hemorrhage. Cervicomedullary junction and pituitary are
within normal limits. No chronic cerebral blood products. No
definite cortical encephalomalacia.

Vascular: Major intracranial vascular flow voids are stable. The
major dural venous sinuses are enhancing and appear to be patent.

Skull and upper cervical spine: Dedicated cervical spine MRI today
reported separately. Visualized bone marrow signal is within normal
limits.

Other: Mastoid air cells remain clear. Visible internal auditory
structures appear normal.

MRI ORBITS FINDINGS

Orbits: Normal suprasellar cistern, but there is asymmetric T2
hyperintensity in the left lateral optic chiasm (series 17, image
6). No enhancement there, but the prechiasmatic left optic nerve is
enlarged, T2 hyperintense, and intensely enhancing intermittently
along its course to the globe. See series 17, image 12, series 19,
images 9 and 13.

Contralateral right optic nerve remains within normal limits.

Cavernous sinuses are within normal limits. Globes are intact. No
other abnormal intraorbital enhancement or inflammation.

Visualized sinuses: Ongoing left maxillary sinus inflammation with
mucosal thickening and trapped secretions. Other Visualized
paranasal sinuses and mastoids are stable and well aerated.

Soft tissues: Negative visible noncontrast deep soft tissue spaces
of the face.
IMPRESSION: 1. Left side Acute Optic Neuritis, and widespread small foci of
Acute Demyelination scattered in both cerebral and cerebellar
hemispheres.
Some of the active demyelination in [REDACTED] is now quiescent, but
approximately 11 new foci of enhancing demyelination are identified
today.

2. No associated vasogenic edema or intracranial mass effect.

3. Dedicated spine MRI today reported separately.

## 2021-08-10 IMAGING — MR MR THORACIC SPINE WO/W CM
10 of 15 series · 19 of 48 positions shown · IV contrast (gadavist)
Comparison: MRI cervical and thoracic spine [DATE]

CLINICAL DATA: Multiple sclerosis.

EXAM:
MRI CERVICAL AND THORACIC SPINE WITHOUT AND WITH CONTRAST
TECHNIQUE: Multiplanar and multiecho pulse sequences of the cervical spine, to
include the craniocervical junction and cervicothoracic junction,
and the thoracic spine, were obtained without and with intravenous
contrast.
CONTRAST:  10mL GADAVIST GADOBUTROL 1 MMOL/ML IV SOLN

[Series 26: T1 · sagittal · 5.0mm · 1.46mm/px · 1 of 9 slices shown (1 of 5)]
[im 1/9]
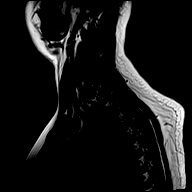

[Series 27: T1 · sagittal · 5.0mm · 1.23mm/px · 1 of 9 slices shown (2 of 5)]
[im 1/9]
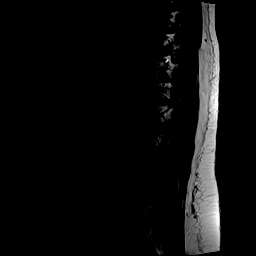

[Series 28: T1 · sagittal · 6.0mm · 1.23mm/px · 1 of 8 slices shown (3 of 5)]
[im 1/8]
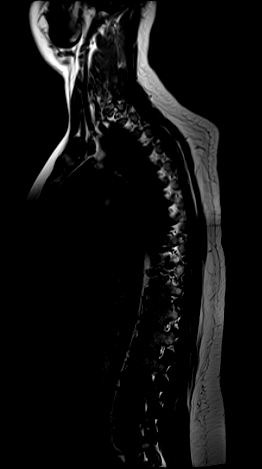

[Series 29: T2 · sagittal · 3.0mm · 0.78mm/px · 1 of 19 slices shown (1 of 2)]
[im 1/19]
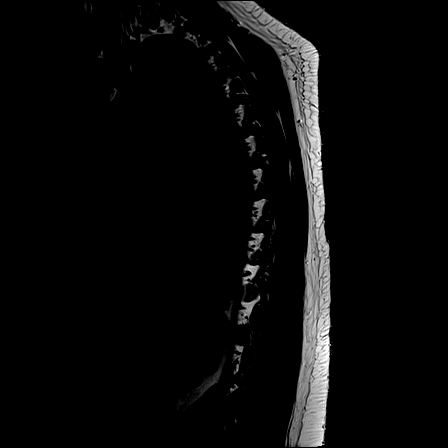

[Series 30: T1 · sagittal · 3.0mm · 0.78mm/px · 1 of 19 slices shown (4 of 5)]
[im 1/19]
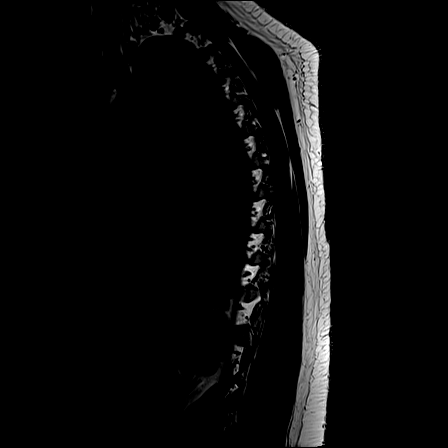

[Series 31: STIR · sagittal · 3.0mm · 0.39mm/px · 1 of 19 slices shown]
[im 1/19]
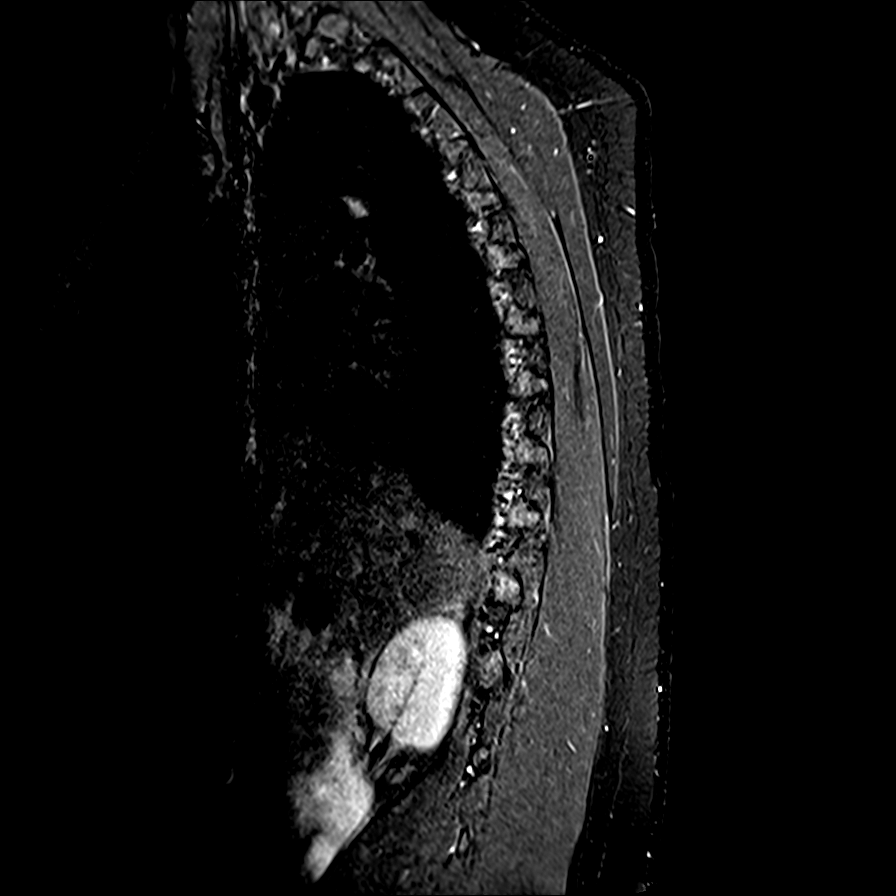

[Series 32: T2 · axial · 4.0mm · 0.59mm/px · z∈[-395,-71]mm · 4 of 55 slices shown (2 of 2)]
[im 1/55]
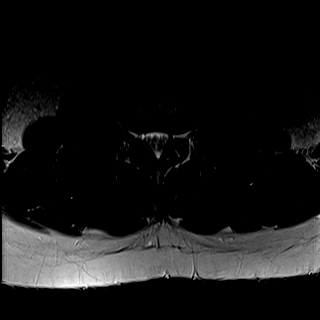
[im 19/55]
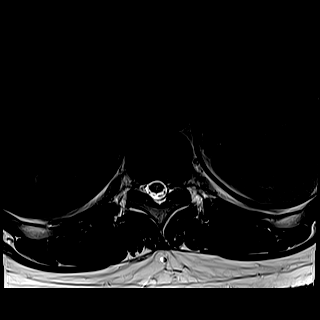
[im 37/55]
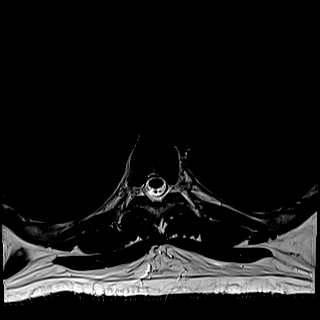
[im 55/55]
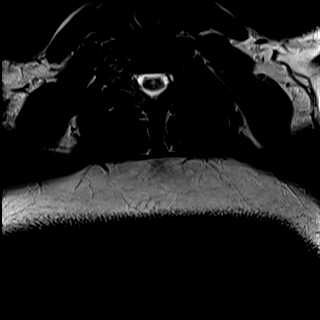

[Series 34: T1 · axial · non-contrast · 4.0mm · 0.31mm/px · z∈[-395,-71]mm · 4 of 55 slices shown (5 of 5)]
[im 1/55]
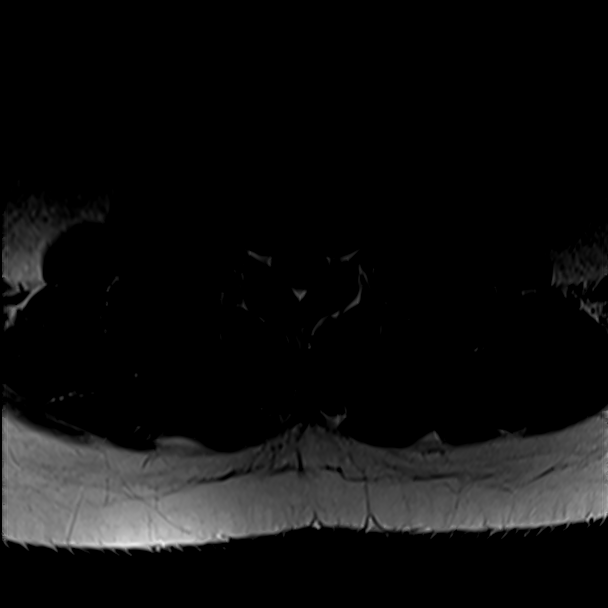
[im 19/55]
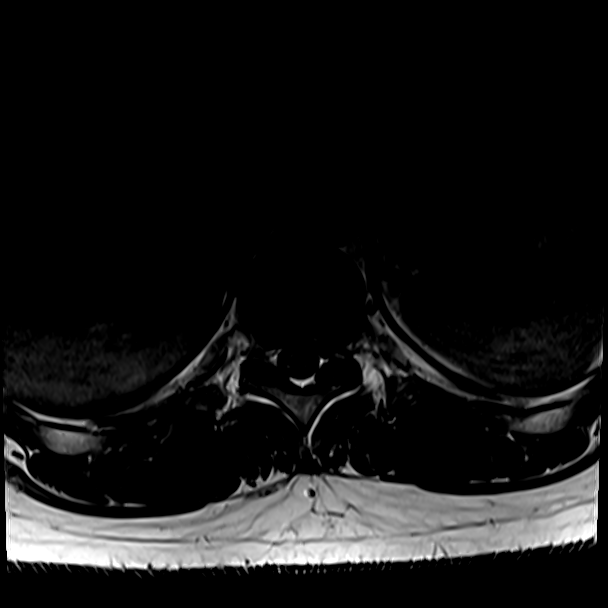
[im 37/55]
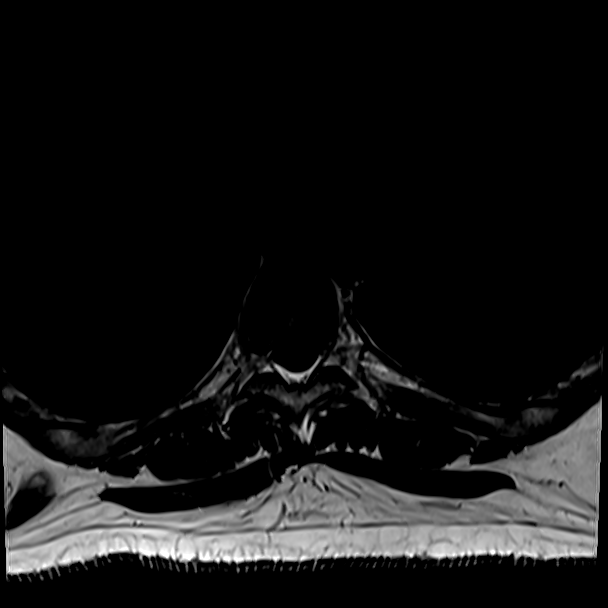
[im 55/55]
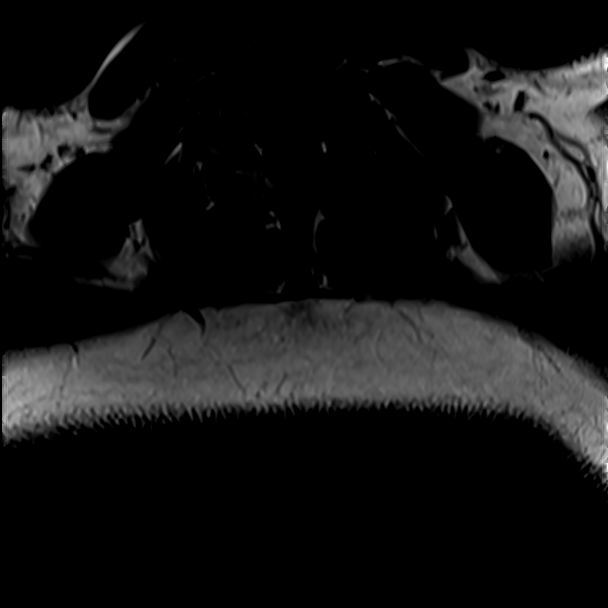

[Series 35: T1 post-contrast · axial · 4.0mm · 0.31mm/px · z∈[-395,-71]mm · 4 of 55 slices shown]
[im 1/55]
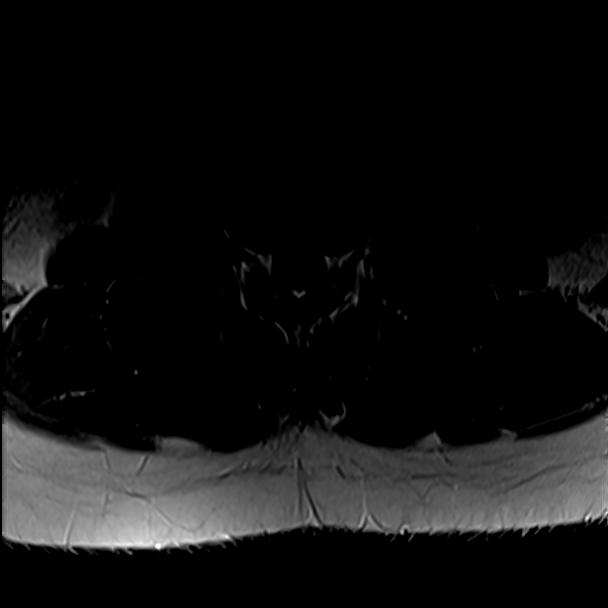
[im 19/55]
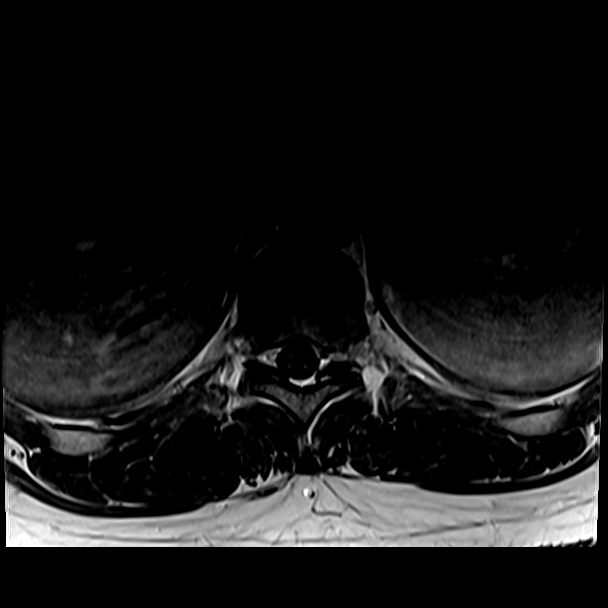
[im 37/55]
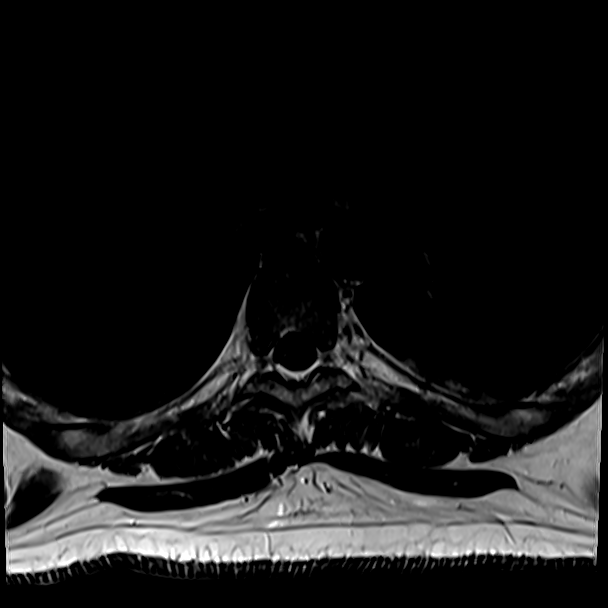
[im 55/55]
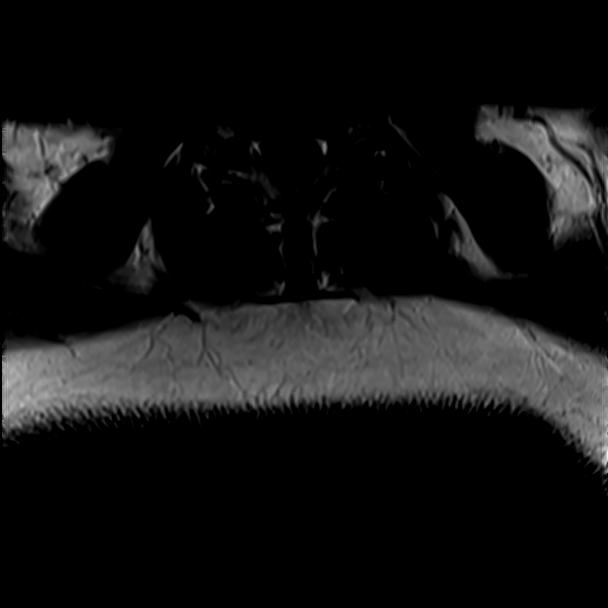

[Series 36: T1 fat-sat post-contrast · sagittal · 3.0mm · 0.78mm/px · 1 of 19 slices shown]
[im 1/19]
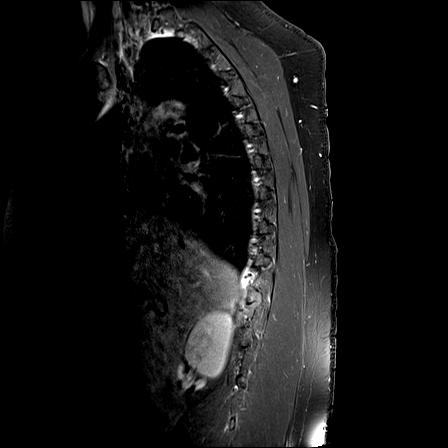

[19 of 48 positions shown; findings below may reference images not displayed]

FINDINGS: MRI CERVICAL SPINE FINDINGS

Alignment: Normal

Vertebrae: Normal bone marrow.  Negative for fracture or mass

Cord: Multiple cord lesions compatible with multiple sclerosis.

Mild improvement in cord lesions at C2 and C3. Improvement in the
lesion at C6-7 which is less apparent on STIR and no longer shows
enhancement

New lesion at the C5 level centrally located and with diffuse
enhancement.

Posterior Fossa, vertebral arteries, paraspinal tissues: Negative

Disc levels:

Normal disc spaces.  No spinal stenosis.

MRI THORACIC SPINE FINDINGS

Alignment:  Normal

Vertebrae: Normal bone marrow.  Negative for fracture or mass.

Cord: Multiple cord lesions are present consistent with multiple
sclerosis. Lesions are less apparent on STIR. Interval resolution of
cord enhancement in the T2-3 level. Mild enhancement at the T9 level
also has resolved. No new enhancing lesion identified. Resolution of
mild enhancement in the T11 lesion.

Paraspinal and other soft tissues: Negative

Disc levels:

Normal
IMPRESSION: 1. Mixed response in the cervical spinal cord. Improvement is
several lesions however there is a new enhancing lesion at C5-6
compatible with active demyelinization
2. Improved lesions in thoracic cord. Lesions are less apparent with
resolution of enhancing lesions at T2-T3, T9, and T11.

## 2021-08-10 MED ORDER — PREDNISONE 20 MG PO TABS: ORAL_TABLET | ORAL | 0 refills | Status: DC

## 2021-08-10 MED ORDER — GADOBUTROL 1 MMOL/ML IV SOLN
10.0000 mL | Freq: Once | INTRAVENOUS | Status: AC | PRN
  Administered 2021-08-10: 10 mL via INTRAVENOUS

## 2021-08-10 MED ORDER — SODIUM CHLORIDE 0.9 % IV SOLN
1000.0000 mg | Freq: Once | INTRAVENOUS | Status: DC
  Filled 2021-08-10: qty 16

## 2021-08-10 MED ORDER — ENOXAPARIN SODIUM 60 MG/0.6ML IJ SOSY
50.0000 mg | PREFILLED_SYRINGE | INTRAMUSCULAR | Status: DC
  Filled 2021-08-10: qty 0.5

## 2021-08-10 MED ORDER — SODIUM CHLORIDE 0.9 % IV SOLN
1000.0000 mg | Freq: Once | INTRAVENOUS | Status: AC
  Administered 2021-08-10: 1000 mg via INTRAVENOUS
  Filled 2021-08-10: qty 16

## 2021-08-10 MED ORDER — SODIUM CHLORIDE 0.9% FLUSH: 3.0000 mL | Freq: Two times a day (BID) | INTRAVENOUS | Status: DC

## 2021-08-10 MED ORDER — ACETAMINOPHEN 650 MG RE SUPP: 650.0000 mg | Freq: Four times a day (QID) | RECTAL | Status: DC | PRN

## 2021-08-10 MED ORDER — ALBUTEROL SULFATE (2.5 MG/3ML) 0.083% IN NEBU: 2.5000 mg | INHALATION_SOLUTION | Freq: Four times a day (QID) | RESPIRATORY_TRACT | Status: DC | PRN

## 2021-08-10 MED ORDER — POTASSIUM CHLORIDE CRYS ER 20 MEQ PO TBCR
40.0000 meq | EXTENDED_RELEASE_TABLET | ORAL | Status: AC
  Administered 2021-08-10: 40 meq via ORAL
  Filled 2021-08-10: qty 2

## 2021-08-10 MED ORDER — ONDANSETRON HCL 4 MG PO TABS: 4.0000 mg | ORAL_TABLET | Freq: Four times a day (QID) | ORAL | Status: DC | PRN

## 2021-08-10 MED ORDER — METOPROLOL TARTRATE 5 MG/5ML IV SOLN: 5.0000 mg | Freq: Four times a day (QID) | INTRAVENOUS | Status: DC | PRN

## 2021-08-10 MED ORDER — PREDNISONE 50 MG PO TABS: 1250.0000 mg | ORAL_TABLET | Freq: Every day | ORAL | 0 refills | Status: DC

## 2021-08-10 MED ORDER — ACETAMINOPHEN 325 MG PO TABS: 650.0000 mg | ORAL_TABLET | Freq: Four times a day (QID) | ORAL | Status: DC | PRN

## 2021-08-10 MED ORDER — ROSUVASTATIN CALCIUM 40 MG PO TABS
40.0000 mg | ORAL_TABLET | Freq: Every day | ORAL | 0 refills | Status: AC
Start: 2021-08-10 — End: ?

## 2021-08-10 MED ORDER — ONDANSETRON HCL 4 MG/2ML IJ SOLN: 4.0000 mg | Freq: Four times a day (QID) | INTRAMUSCULAR | Status: DC | PRN

## 2021-08-10 NOTE — Consult Note (Addendum)
[facility] Medical Consultation  Willie Archer [id number] DOB: 06-Sep-1995 DOA: 08/09/2021 PCP: Willie Bottom, MD   Requesting physician: Rayburn Ma, PA-C Date of consultation: 08/10/2021 Reason for consultation: Optic neuritis  Impression/Recommendations Principal Problem:   Optic neuritis due to multiple sclerosis [facility]) Active Problems:   Hyperlipidemia   Vitamin D deficiency   Relapsing remitting multiple sclerosis ([place])   Hypokalemia   Obesity (BMI 30.0-34.9)    Optic neuritis left eye  relapsing remitting multiple sclerosis: Acute.  Patient presents with complaints of blurry vision out of the left eye as well as pain started approximately 10-11 days ago. Imaging showed left-sided acute optic neuritis,  widespread small foci of acute demyelination scattered in both cerebral and cerebellar hemispheres with 11 new foci, mixed response in the cervical cord with improvement in several lesions with new enhancing lesions C5-C6 active demyelinization, and improvement in lesions of the thoracic region.  Patient requested to be discharged home after receiving 1 g of Solu-Medrol IV in the ED. -Admit to a MedSurg bed -Neurochecks -neurology to transition to p.o. steroids(1250 mg po qd x 5 days. I figured that to 50mg  tablets-take 25 tabs qd for 4 days starting 08/11/21) -[place] neurology consultative services.  Patient was advised to follow-up with his regular neurologist Willie Archer tomorrow morning and given precautions to return to the hospital by neurology  Hypokalemia: Potassium 3.2 on admission. -Give potassium chloride 40 mEq p.o. x1 dose -Continue to monitor and replace as needed   Hyperlipidemia -Continue Crestor  Vitamin D deficiency: Diagnosed during last hospitalization low vitamin D level 10.06. -Resume vitamin D supplementation and outpatient setting  Obesity: BMI 31.93 kg/m2  Lacks PCP: Patient should be advised to set up care with a primary care  provider in the outpatient setting to manage all of his other medical problems.  I will followup again tomorrow. Please contact me if I can be of assistance in the meanwhile. Thank you for this consultation.  Chief Complaint: Left eye pain and blurry vision  HPI:  Willie Archer is a 26 y.o. male with medical history significant of Willie Archer is a 26 y.o. male with medical history significant of newly diagnosed multiple sclerosis 06/2021, hyperlipidemia, and vitamin D deficiency presents with complaints of left eye pain and blurry vision.  Symptoms started about 10-11 days ago and patient noted eye discomfort on looking either left or right.  He is able to verbalize that the blurry vision was coming just from his left eye 3 days ago.  He had just recently been hospitalized 12/16-12/21/2022 diagnosed with MS by MRI.  Work-up include ACE level, HIV, Lyme titer, and NMO antibodies were negative at that time.  He had followed up in outpatient setting with Willie Archer of neurology on 1/10.  Review of records note that blood work was obtained and it was planned for him to possibly be started on Tysabri, Ocrevus, or oral S1 P2 receptor modulator. Patient notes that since that time he has had associated symptoms of decreased sensation in his hands and feet for which he is off balance at times.  Denies having any significant falls.  He notes that he has a relatively on his mother side that has MS and is pretty debilitated.  However, patient does not wish to stay in the hospital if he does not have to.   ED Course: On admission into the emergency department patient was seen to be afebrile with blood pressures elevated to 150/105, and all other  vital signs maintained.  Labs significant for potassium 3.2.  Influenza and COVID-19 screening were negative.  Neurology have been formally consulted.  MRI of the brain, orbits, cervical, and thoracic spine have been obtained.  Imaging showed left-sided acute optic  neuritis,  widespread small foci of acute demyelination scattered in both cerebral and cerebellar hemispheres with 11 new foci, mixed response in the cervical cord with improvement in several lesions with new enhancing lesions C5-C6 active demyelinization, and improvement in lesions of the thoracic region.  Patient had been started on Solu-Medrol 1 g.  [facility] called to admit.   Review of Systems  Constitutional:  Negative for fever and malaise/fatigue.  HENT:  Negative for congestion and nosebleeds.   Eyes:  Positive for blurred vision and pain.  Respiratory:  Negative for cough and shortness of breath.   Cardiovascular:  Negative for chest pain and leg swelling.  Gastrointestinal:  Negative for abdominal pain, nausea and vomiting.  Genitourinary:  Negative for dysuria and hematuria.  Musculoskeletal:  Negative for falls and myalgias.  Neurological:  Negative for loss of consciousness and headaches.  Psychiatric/Behavioral:  Negative for substance abuse.        Past Medical History:  Diagnosis Date   Hyperlipidemia    MS (multiple sclerosis) ([place])    Vitamin D deficiency    Past Surgical History:  Procedure Laterality Date   ACHILLES TENDON SURGERY Bilateral 2007   Dr Willie Archer   Social History:  reports that he has never smoked. He has never used smokeless tobacco. He reports current alcohol use. He reports that he does not use drugs.  No Known Allergies Family History  Problem Relation Age of Onset   Diabetes Mother    Diabetes Father     Prior to Admission medications   Medication Sig Start Date End Date Taking? Authorizing Provider  Cyanocobalamin (B-12 PO) Take 1 tablet by mouth daily.   Yes [provider]  Multiple Vitamin (MULTIVITAMIN ADULT PO) Take 1 tablet by mouth daily.   Yes [provider]  Vitamin D, Ergocalciferol, (DRISDOL) 1.25 MG (50000 UNIT) CAPS capsule Take 1 capsule (50,000 Units total) by mouth every 7 (seven) days. Patient taking  differently: Take 50,000 Units by mouth every 7 (seven) days. sundays 07/14/21  Yes Willie Riding, MD  predniSONE (DELTASONE) 20 MG tablet Take 20 tabs ( 400 mg)  po after breakfast on 08-11-2021 and another 10 tabs on Monday after breakfast bu mouth. Patient not taking: Reported on 08/10/2021 08/10/21   Dohmeier, Asencion Partridge, MD  rosuvastatin (CRESTOR) 40 MG tablet Take 40 mg by mouth daily. Patient not taking: Reported on 08/10/2021    [provider]  traZODone (DESYREL) 50 MG tablet Take 1 tablet (50 mg total) by mouth at bedtime. 07/29/21   Sater, Nanine Means, MD   Physical Exam:  Constitutional: NAD, calm, comfortable Vitals:   08/10/21 0656 08/10/21 0732 08/10/21 0734 08/10/21 1204  BP: (!) 143/96 (!) 141/90 (!) 141/90 131/87  Pulse: 70 88 88 77  Resp: 16 12 12 15   Temp:  98 F (36.7 C) 98 F (36.7 C)   TempSrc:   Oral   SpO2: 100% 100% 100% 96%  Weight:      Height:       Eyes: PERRL, lids and conjunctivae normal ENMT: Mucous membranes are moist. Neck: normal, supple, no masses,   Respiratory: clear to auscultation bilaterally, no wheezing, no crackles. Normal respiratory effort. No accessory muscle use.  Cardiovascular: Regular rate and rhythm,  no murmurs / rubs / gallops. No extremity edema.  Musculoskeletal: no clubbing / cyanosis.  Skin: no rashes, lesions, ulcers. No induration Neurologic: CN 2-12 grossly intact. Sensation abnormal. Decreased vision noted left eye Psychiatric: Normal judgment and insight. Alert and oriented x 3.  Anxious mood.   Labs on Admission:  Basic Metabolic Panel: Recent Labs  Lab 08/10/21 0754  NA 139  K 3.2*  CL 103  CO2 25  GLUCOSE 80  BUN 14  CREATININE 0.89  CALCIUM 9.4   Liver Function Tests: No results for input(s): AST, ALT, ALKPHOS, BILITOT, PROT, ALBUMIN in the last 168 hours. No results for input(s): LIPASE, AMYLASE in the last 168 hours. No results for input(s): AMMONIA in the last 168 hours. CBC: Recent Labs  Lab  08/10/21 0754  WBC 8.8  NEUTROABS 4.5  HGB 14.1  HCT 42.2  MCV 80.5  PLT 356   Cardiac Enzymes: No results for input(s): CKTOTAL, CKMB, CKMBINDEX, TROPONINI in the last 168 hours. BNP: Invalid input(s): POCBNP CBG: No results for input(s): GLUCAP in the last 168 hours.  Radiological Exams on Admission: MR Brain W and Wo Contrast  Result Date: 08/10/2021 CLINICAL DATA:  26 year old male with demyelinating disease on MRI brain and spine last month. Multiple sclerosis. EXAM: MRI HEAD AND ORBITS WITHOUT AND WITH CONTRAST TECHNIQUE: Multiplanar, multiecho pulse sequences of the brain and surrounding structures were obtained without and with intravenous contrast. Multiplanar, multiecho pulse sequences of the orbits and surrounding structures were obtained including fat saturation techniques, before and after intravenous contrast administration. CONTRAST:  67mL GADAVIST GADOBUTROL 1 MMOL/ML IV SOLN COMPARISON:  Brain MRI 07/05/2021. Cervical and thoracic MRI today reported separately. FINDINGS: MRI HEAD FINDINGS Brain: Of the multiple scattered small foci of abnormal enhancement seen on MRI last month, small lesions in the anterior corona radiata (series 27, image 35 today), near the posterior right body of the corpus callosum (same image), along the right parietooccipital sulcus persist. The left cerebellar enhancement has resolved. But there are multiple new small foci of abnormal enhancement scattered in both cerebral hemispheres (series 27), with approximately eleven new areas of involvement identified (including 2 in the cerebellum which are better demonstrated on the dedicated orbit images, series 20). Associated abnormal DWI in those areas. T2 and FLAIR hyperintensity but no vasogenic edema or mass effect. Underlying extensive bilateral periventricular white matter T2 and FLAIR hyperintensity elsewhere has not significantly changed. Involvement of the posterior limbs left deep white matter  capsules and left lateral thalamus as before. Involvement of the left cerebellar peduncle, right lateral pons. And small scattered involvement of the cerebellar hemispheres. No superimposed restricted diffusion suggestive of acute infarction. No dural thickening. No midline shift, mass effect, evidence of mass lesion, ventriculomegaly, extra-axial collection or acute intracranial hemorrhage. Cervicomedullary junction and pituitary are within normal limits. No chronic cerebral blood products. No definite cortical encephalomalacia. Vascular: Major intracranial vascular flow voids are stable. The major dural venous sinuses are enhancing and appear to be patent. Skull and upper cervical spine: Dedicated cervical spine MRI today reported separately. Visualized bone marrow signal is within normal limits. Other: Mastoid air cells remain clear. Visible internal auditory structures appear normal. MRI ORBITS FINDINGS Orbits: Normal suprasellar cistern, but there is asymmetric T2 hyperintensity in the left lateral optic chiasm (series 17, image 6). No enhancement there, but the prechiasmatic left optic nerve is enlarged, T2 hyperintense, and intensely enhancing intermittently along its course to the globe. See series 17, image 12, series 19, images 9  and 13. Contralateral right optic nerve remains within normal limits. Cavernous sinuses are within normal limits. Globes are intact. No other abnormal intraorbital enhancement or inflammation. Visualized sinuses: Ongoing left maxillary sinus inflammation with mucosal thickening and trapped secretions. Other Visualized paranasal sinuses and mastoids are stable and well aerated. Soft tissues: Negative visible noncontrast deep soft tissue spaces of the face. IMPRESSION: 1. Left side Acute Optic Neuritis, and widespread small foci of Acute Demyelination scattered in both cerebral and cerebellar hemispheres. Some of the active demyelination in December is now quiescent, but  approximately 11 new foci of enhancing demyelination are identified today. 2. No associated vasogenic edema or intracranial mass effect. 3. Dedicated spine MRI today reported separately. Electronically Signed   By: Genevie Ann M.D.   On: 08/10/2021 12:23   MR Cervical Spine W or Wo Contrast  Result Date: 08/10/2021 CLINICAL DATA:  Multiple sclerosis. EXAM: MRI CERVICAL AND THORACIC SPINE WITHOUT AND WITH CONTRAST TECHNIQUE: Multiplanar and multiecho pulse sequences of the cervical spine, to include the craniocervical junction and cervicothoracic junction, and the thoracic spine, were obtained without and with intravenous contrast. CONTRAST:  62mL GADAVIST GADOBUTROL 1 MMOL/ML IV SOLN COMPARISON:  MRI cervical and thoracic spine 07/05/2021 FINDINGS: MRI CERVICAL SPINE FINDINGS Alignment: Normal Vertebrae: Normal bone marrow.  Negative for fracture or mass Cord: Multiple cord lesions compatible with multiple sclerosis. Mild improvement in cord lesions at C2 and C3. Improvement in the lesion at C6-7 which is less apparent on STIR and no longer shows enhancement New lesion at the C5 level centrally located and with diffuse enhancement. Posterior Fossa, vertebral arteries, paraspinal tissues: Negative Disc levels: Normal disc spaces.  No spinal stenosis. MRI THORACIC SPINE FINDINGS Alignment:  Normal Vertebrae: Normal bone marrow.  Negative for fracture or mass. Cord: Multiple cord lesions are present consistent with multiple sclerosis. Lesions are less apparent on STIR. Interval resolution of cord enhancement in the T2-3 level. Mild enhancement at the T9 level also has resolved. No new enhancing lesion identified. Resolution of mild enhancement in the T11 lesion. Paraspinal and other soft tissues: Negative Disc levels: Normal IMPRESSION: 1. Mixed response in the cervical spinal cord. Improvement is several lesions however there is a new enhancing lesion at C5-6 compatible with active demyelinization 2. Improved lesions  in thoracic cord. Lesions are less apparent with resolution of enhancing lesions at T2-T3, T9, and T11. Electronically Signed   By: Franchot Gallo M.D.   On: 08/10/2021 12:45   MR THORACIC SPINE W WO CONTRAST  Result Date: 08/10/2021 CLINICAL DATA:  Multiple sclerosis. EXAM: MRI CERVICAL AND THORACIC SPINE WITHOUT AND WITH CONTRAST TECHNIQUE: Multiplanar and multiecho pulse sequences of the cervical spine, to include the craniocervical junction and cervicothoracic junction, and the thoracic spine, were obtained without and with intravenous contrast. CONTRAST:  67mL GADAVIST GADOBUTROL 1 MMOL/ML IV SOLN COMPARISON:  MRI cervical and thoracic spine 07/05/2021 FINDINGS: MRI CERVICAL SPINE FINDINGS Alignment: Normal Vertebrae: Normal bone marrow.  Negative for fracture or mass Cord: Multiple cord lesions compatible with multiple sclerosis. Mild improvement in cord lesions at C2 and C3. Improvement in the lesion at C6-7 which is less apparent on STIR and no longer shows enhancement New lesion at the C5 level centrally located and with diffuse enhancement. Posterior Fossa, vertebral arteries, paraspinal tissues: Negative Disc levels: Normal disc spaces.  No spinal stenosis. MRI THORACIC SPINE FINDINGS Alignment:  Normal Vertebrae: Normal bone marrow.  Negative for fracture or mass. Cord: Multiple cord lesions are present consistent with multiple  sclerosis. Lesions are less apparent on STIR. Interval resolution of cord enhancement in the T2-3 level. Mild enhancement at the T9 level also has resolved. No new enhancing lesion identified. Resolution of mild enhancement in the T11 lesion. Paraspinal and other soft tissues: Negative Disc levels: Normal IMPRESSION: 1. Mixed response in the cervical spinal cord. Improvement is several lesions however there is a new enhancing lesion at C5-6 compatible with active demyelinization 2. Improved lesions in thoracic cord. Lesions are less apparent with resolution of enhancing  lesions at T2-T3, T9, and T11. Electronically Signed   By: Franchot Gallo M.D.   On: 08/10/2021 12:45   MR ORBITS W WO CONTRAST  Result Date: 08/10/2021 CLINICAL DATA:  26 year old male with demyelinating disease on MRI brain and spine last month. Multiple sclerosis. EXAM: MRI HEAD AND ORBITS WITHOUT AND WITH CONTRAST TECHNIQUE: Multiplanar, multiecho pulse sequences of the brain and surrounding structures were obtained without and with intravenous contrast. Multiplanar, multiecho pulse sequences of the orbits and surrounding structures were obtained including fat saturation techniques, before and after intravenous contrast administration. CONTRAST:  1mL GADAVIST GADOBUTROL 1 MMOL/ML IV SOLN COMPARISON:  Brain MRI 07/05/2021. Cervical and thoracic MRI today reported separately. FINDINGS: MRI HEAD FINDINGS Brain: Of the multiple scattered small foci of abnormal enhancement seen on MRI last month, small lesions in the anterior corona radiata (series 27, image 35 today), near the posterior right body of the corpus callosum (same image), along the right parietooccipital sulcus persist. The left cerebellar enhancement has resolved. But there are multiple new small foci of abnormal enhancement scattered in both cerebral hemispheres (series 27), with approximately eleven new areas of involvement identified (including 2 in the cerebellum which are better demonstrated on the dedicated orbit images, series 20). Associated abnormal DWI in those areas. T2 and FLAIR hyperintensity but no vasogenic edema or mass effect. Underlying extensive bilateral periventricular white matter T2 and FLAIR hyperintensity elsewhere has not significantly changed. Involvement of the posterior limbs left deep white matter capsules and left lateral thalamus as before. Involvement of the left cerebellar peduncle, right lateral pons. And small scattered involvement of the cerebellar hemispheres. No superimposed restricted diffusion suggestive of  acute infarction. No dural thickening. No midline shift, mass effect, evidence of mass lesion, ventriculomegaly, extra-axial collection or acute intracranial hemorrhage. Cervicomedullary junction and pituitary are within normal limits. No chronic cerebral blood products. No definite cortical encephalomalacia. Vascular: Major intracranial vascular flow voids are stable. The major dural venous sinuses are enhancing and appear to be patent. Skull and upper cervical spine: Dedicated cervical spine MRI today reported separately. Visualized bone marrow signal is within normal limits. Other: Mastoid air cells remain clear. Visible internal auditory structures appear normal. MRI ORBITS FINDINGS Orbits: Normal suprasellar cistern, but there is asymmetric T2 hyperintensity in the left lateral optic chiasm (series 17, image 6). No enhancement there, but the prechiasmatic left optic nerve is enlarged, T2 hyperintense, and intensely enhancing intermittently along its course to the globe. See series 17, image 12, series 19, images 9 and 13. Contralateral right optic nerve remains within normal limits. Cavernous sinuses are within normal limits. Globes are intact. No other abnormal intraorbital enhancement or inflammation. Visualized sinuses: Ongoing left maxillary sinus inflammation with mucosal thickening and trapped secretions. Other Visualized paranasal sinuses and mastoids are stable and well aerated. Soft tissues: Negative visible noncontrast deep soft tissue spaces of the face. IMPRESSION: 1. Left side Acute Optic Neuritis, and widespread small foci of Acute Demyelination scattered in both cerebral and cerebellar  hemispheres. Some of the active demyelination in December is now quiescent, but approximately 11 new foci of enhancing demyelination are identified today. 2. No associated vasogenic edema or intracranial mass effect. 3. Dedicated spine MRI today reported separately. Electronically Signed   By: Genevie Ann M.D.   On:  08/10/2021 12:23      Time spent: >45 minutes  [facility]   If 7PM-7AM, please contact night-coverage

## 2021-08-10 NOTE — ED Notes (Signed)
MRI to come get pt, pt and family made aware. Pt gave phone and wallet to family.

## 2021-08-10 NOTE — Discharge Instructions (Addendum)
As we discussed it is crucial that you follow-up with your neurologist for further management of your multiple sclerosis, optic neuritis.  In order to maintain as much of your visual acuity, and have the least progression of your symptoms is possible please call his office first thing in the morning.  We have sent your prednisone prescription, as well as your Crestor to the [facility] on [facility] as requested.  In order to receive a regular prescription for your statin past this month, please follow-up with your primary care provider so that they can continue to manage your high cholesterol.

## 2021-08-10 NOTE — ED Notes (Signed)
Patient was advised by their dr. To come into the ER for a type of steroid injection. 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

## 2021-08-10 NOTE — ED Notes (Signed)
Pt agreed to wait on a holding room in ER and agreed to workup.

## 2021-08-10 NOTE — Consult Note (Signed)
Neurology Consultation  Reason for Consult: MS flare.  Referring Physician: Montez Morita, PA-C.   CC: New left eye pain, tingling, and blurred vision.   History is obtained from: patient, chart.   HPI: Willie Archer is a 26 y.o. male with new diagnosis of MS made in 12/22. He sees Dr. Epimenio Foot at [facility]. He saw Dr. Epimenio Foot on 07/29/21 and is awaiting blood work results to begin on an oral agent for tighter management of his symptoms.   Patient reports blurred vision OS and tingling in LEs. He denies any weakness. States even PJs hurt when touch legs. No complete loss of vision OS. No double vision. No incontinence or ataxia with walking.   Patient is asking for an out patient equivalent treatment. NP informed him that IV is better and that we would check him in 3 days. If getting better, we will continue IV steroids for total of 5 days. If not better, will do PLEX.   NP called back to bedside later-patient is not going to get a bed in hospital for at least 12 more hours and he is really wanting to leave. NP reviewed treatment course and studies show that oral prednisone can be as effective for MS flare as IV. However, IV is preferred for optic neuritis. NP explained that if his vision gets more blurry and certainly if he has loss of vision, he is to return to ED stat. NP asked patient to call Dr. Epimenio Foot in the am and tell him about this hospital visit, and make appointment to see him. I explained the amount of pills he will have to take daily to equal high dose IV steroids and he understands. NP advised patient to take Protonix qd to avoid GI upset. NP explained that steroids can cause hunger, sweating, mood changes, and insomnia. Male family member at bedside and agrees with plan and will monitor patient's compliance. Patient agrees with this out patient plan.   ROS: A robust ROS was performed and is negative except as noted in the HPI.   Past Medical History:  Diagnosis Date   Hyperlipidemia    MS  (multiple sclerosis) (HCC)    Vitamin D deficiency    Family History  Problem Relation Age of Onset   Diabetes Mother    Diabetes Father    Social History:   reports that he has never smoked. He has never used smokeless tobacco. He reports current alcohol use. He reports that he does not use drugs.  Medications  Current Facility-Administered Medications:    potassium chloride SA (KLOR-CON M) CR tablet 40 mEq, 40 mEq, Oral, STAT, Smith, Rondell A, MD  Current Outpatient Medications:    Cyanocobalamin (B-12 PO), Take 1 tablet by mouth daily., Disp: , Rfl:    Multiple Vitamin (MULTIVITAMIN ADULT PO), Take 1 tablet by mouth daily., Disp: , Rfl:    predniSONE (DELTASONE) 20 MG tablet, Take 20 tabs ( 400 mg)  po after breakfast on 08-11-2021 and another 10 tabs on Monday after breakfast bu mouth., Disp: 30 tablet, Rfl: 0   rosuvastatin (CRESTOR) 40 MG tablet, Take 40 mg by mouth daily., Disp: , Rfl:    traZODone (DESYREL) 50 MG tablet, Take 1 tablet (50 mg total) by mouth at bedtime., Disp: 30 tablet, Rfl: 5   Vitamin D, Ergocalciferol, (DRISDOL) 1.25 MG (50000 UNIT) CAPS capsule, Take 1 capsule (50,000 Units total) by mouth every 7 (seven) days., Disp: 7 capsule, Rfl: 0  Exam: Current vital signs: BP 131/87 (BP  Location: Right Arm)    Pulse 77    Temp 98 F (36.7 C) (Oral)    Resp 15    Ht 5\' 8"  (1.727 m)    Wt 95.3 kg    SpO2 96%    BMI 31.93 kg/m  Vital signs in last 24 hours: Temp:  [98 F (36.7 C)-98.6 F (37 C)] 98 F (36.7 C) (01/22 0734) Pulse Rate:  [70-88] 77 (01/22 1204) Resp:  [12-18] 15 (01/22 1204) BP: (131-150)/(82-105) 131/87 (01/22 1204) SpO2:  [96 %-100 %] 96 % (01/22 1204) Weight:  [95.3 kg] 95.3 kg (01/21 2110)  PE: GENERAL: Well appearing male, awake, alert in NAD.  HEENT: normocephalic and atraumatic. LUNGS - Normal respiratory effort.  CV - RRR on tele. ABDOMEN - Soft, nontender. Ext: warm, well perfused. Psych: affect concerned but light.   NEURO:   Mental Status: Alert and oriented x 4. Follows commands.  Speech/Language: speech is without dysarthria or aphasia.  Naming, repetition, fluency, and comprehension intact.  Cranial Nerves:  II: PERRL. Visual fields full.  III, IV, VI: EOMI. Lid elevation symmetric and full.  V: sensation is intact and symmetrical to face.  VII: Smile is symmetrical.  VIII:hearing intact to voice. IX, X: palate elevation is symmetric. Phonation normal.  XI: normal sternocleidomastoid and trapezius muscle strength. 2111 is symmetrical without fasciculations.   Motor:  5/5 strength throughout.  Tone is normal. Bulk is normal.  Sensation- Intact to light touch bilaterally in all four extremities.  Gait- deferred.  Labs I have reviewed labs in epic and the results pertinent to this consultation are: No leukocytosis.    CBC    Component Value Date/Time   WBC 8.8 08/10/2021 0754   RBC 5.24 08/10/2021 0754   HGB 14.1 08/10/2021 0754   HCT 42.2 08/10/2021 0754   PLT 356 08/10/2021 0754   MCV 80.5 08/10/2021 0754   MCH 26.9 08/10/2021 0754   MCHC 33.4 08/10/2021 0754   RDW 13.5 08/10/2021 0754   LYMPHSABS 3.3 08/10/2021 0754   MONOABS 0.9 08/10/2021 0754   EOSABS 0.1 08/10/2021 0754   BASOSABS 0.0 08/10/2021 0754    CMP     Component Value Date/Time   NA 139 08/10/2021 0754   K 3.2 (L) 08/10/2021 0754   CL 103 08/10/2021 0754   CO2 25 08/10/2021 0754   GLUCOSE 80 08/10/2021 0754   BUN 14 08/10/2021 0754   CREATININE 0.89 08/10/2021 0754   CREATININE 0.97 03/20/2016 1115   CALCIUM 9.4 08/10/2021 0754   PROT 7.8 07/05/2021 0318   ALBUMIN 3.7 07/05/2021 0318   AST 14 (L) 07/05/2021 0318   ALT 30 07/05/2021 0318   ALKPHOS 65 07/05/2021 0318   BILITOT 0.8 07/05/2021 0318   GFRNONAA >60 08/10/2021 0754   GFRNONAA >89 03/20/2016 1115   GFRAA >89 03/20/2016 1115    Imaging MD reviewed the images obtained.  MRI brain and orbits.  1. Left side Acute Optic Neuritis, and  widespread small foci of Acute Demyelination scattered in both cerebral and cerebellar hemispheres. Some of the active demyelination in December is now quiescent, but approximately 11 new foci of enhancing demyelination are identified today.  2. No associated vasogenic edema or intracranial mass effect.  3. Dedicated spine MRI today reported separately.  MRI C/T/L spine.  1. Mixed response in the cervical spinal cord. Improvement is several lesions however there is a new enhancing lesion at C5-6 compatible with active demyelinization 2. Improved lesions in thoracic cord. Lesions are less  apparent with resolution of enhancing lesions at T2-T3, T9, and T11.  Assessment: 26 yo with new diagnosis of MS in 06/2021. He presented 18 hours ago with blurred vision and increased tingling in LEs. He has been evaluated by Dr. Epimenio Foot at [facility] and plan is for daily medications. We wanted to keep him to evaluate IV steroid response and decide whether to move on to PLEX, but patient was insistent on leaving. He is aware that he has new demyelinating lesions on imaging and this is aggressive. He will go home on Prednisone 50mg  tabs #100 Sig: take 25 tablets qd x 4 days starting tomorrow. He received one dose of IV steroids here today. No signs of infection-afebrile, no leukocytosis.   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   Recommendations/Plan:  -He is to call Dr. Bonnita Hollow office tomorrow and let him know he was here and what the treatment is. Hopefully, get into office that day or very soon.  -He is to return to ED urgently should his vision worsen or he have any signs of worsening tingling, difficulty walking, incontinence, or weakness.  -NP discussed risk vs benefits of IV vs oral steroids and discussed that optimal treatment would be for him to stay and finish IV steroids in case we need to move on to PLEX. He insisted on going home.  -He will take a total of 1250mg  of  prednisone qd x 4 days starting tomorrow. Rx: 50mg  tabs #100. Sig: 25 tablets po qd x 4 days starting 08/11/21.  Up to Date: Treatment of acute exacerbations of multiple sclerosis in adults.  Pt seen by Jimmye Norman, NP/Neuro and later by MD. Note/plan to be edited by MD as needed.  Pager: [phone]   NEUROHOSPITALIST ADDENDUM I was unable to perform a face to face diagnostic evaluation as patient has left the ED before I could get to him.  I have reviewed the contents of history and physical exam as documented by PA/ARNP/Resident and agree with above documentation.  I have discussed and formulated the above plan as documented. Edits to the note have been made as needed.  Impression/Plan: I was unable to evaluate Willie Archer as he had unfortunately left the ED prior to my evaluation. I was extensively involved in planing his care. Essentially presented with pain with L eye movement. MRI Brain, Orbits, C, T spine with L optic neuritis and several small foci of enhancement consistent with an MS flare up. He was advised 3 days of IVMP and was given first dose in the ED, followed by revisiting discussion to transition to PO steroids. He opted to go with PO steroids at this time. Will need to take 1250mg  PO Prednisone daily for 4 additional days starting tomorrow. He will also pick up PPI over the counter. Return precautions were discussed specifically worsening of his vision or new arm or leg weakness or numbness.  No charge note.  Erick Blinks, MD [facility] [phone]   If 7pm to 7am, please call on call as listed on AMION.

## 2021-08-10 NOTE — ED Notes (Signed)
Patient transported to MRI. Waiting on solu-medrol from pharmacy still

## 2021-08-10 NOTE — ED Notes (Signed)
Pt and family verbalized understanding of discharge paperwork, follow-up care and prescription.

## 2021-08-10 NOTE — Telephone Encounter (Signed)
Prednisone oral dose called into [facility]. Advised to take Pepcid with it. Reviewed ED note- : eye problem" no other qualifier was used in triage.  Not optic neuritis, Vision loss or MS.  I understand  Willie Archer's frustration but had to explain that I cannot treat the patient in the ED.

## 2021-08-10 NOTE — ED Provider Notes (Addendum)
[facility] EMERGENCY DEPARTMENT Provider Note   CSN: [id number] Arrival date & time: 08/09/21  2104     History  Chief Complaint  Patient presents with   Eye Problem    Willie Archer is a 26 y.o. male with a past medical history significant for recent diagnosis of multiple sclerosis, who just completed hospital stay after his primary diagnosis on 07/04/2021.  Patient currently presumed to have relapsing remitting, but given the severity of lesions on his MRI, some thought that he may develop secondary progressive.  Patient currently following with [facility], most recently seen Dr. Felecia Shelling on 1/10, he is still awaiting blood work to begin on an oral agent for tighter management of his symptoms.  Patient now with complaints of worsening blurry vision, pain in left eye with extraocular movements since around 1/11, as well as he complains of some tingling in his hands and feet, as well as ongoing coordination difficulties.  Patient if possible would like to avoid admission, or additional IV doses of steroids, curious about oral option for steroids.  Patient arrives with note from Dr. Brett Fairy of [facility] requesting high-dose IV steroids.   Eye Problem Associated symptoms: numbness       Home Medications Prior to Admission medications   Medication Sig Start Date End Date Taking? Authorizing Provider  Cyanocobalamin (B-12 PO) Take 1 tablet by mouth daily.    [provider]  Multiple Vitamin (MULTIVITAMIN ADULT PO) Take 1 tablet by mouth daily.    [provider]  predniSONE (DELTASONE) 20 MG tablet Take 20 tabs ( 400 mg)  po after breakfast on 08-11-2021 and another 10 tabs on Monday after breakfast bu mouth. 08/10/21   Dohmeier, Asencion Partridge, MD  rosuvastatin (CRESTOR) 40 MG tablet Take 40 mg by mouth daily.    [provider]  traZODone (DESYREL) 50 MG tablet Take 1 tablet (50 mg total) by mouth at bedtime. 07/29/21   Sater,  Nanine Means, MD  Vitamin D, Ergocalciferol, (DRISDOL) 1.25 MG (50000 UNIT) CAPS capsule Take 1 capsule (50,000 Units total) by mouth every 7 (seven) days. 07/14/21   Mercy Riding, MD      Allergies    Patient has no known allergies.    Review of Systems   Review of Systems  Eyes:  Positive for pain and visual disturbance.  Neurological:  Positive for numbness.  All other systems reviewed and are negative.  Physical Exam Updated Vital Signs BP 131/87 (BP Location: Right Arm)    Pulse 77    Temp 98 F (36.7 C) (Oral)    Resp 15    Ht 5\' 8"  (1.727 m)    Wt 95.3 kg    SpO2 96%    BMI 31.93 kg/m  Physical Exam Vitals and nursing note reviewed.  Constitutional:      General: He is not in acute distress.    Appearance: Normal appearance.  HENT:     Head: Normocephalic and atraumatic.  Eyes:     General:        Right eye: No discharge.        Left eye: No discharge.     Comments: Minimal erythema of left eye compared to right  Cardiovascular:     Rate and Rhythm: Normal rate and regular rhythm.     Heart sounds: No murmur heard.   No friction rub. No gallop.  Pulmonary:     Effort: Pulmonary effort is normal.     Breath  sounds: Normal breath sounds.  Abdominal:     General: Bowel sounds are normal.     Palpations: Abdomen is soft.     Comments: No tenderness to palpation abdomen  Skin:    General: Skin is warm and dry.     Capillary Refill: Capillary refill takes less than 2 seconds.  Neurological:     Mental Status: He is alert and oriented to person, place, and time.     Comments: Patient with significantly decreased vision left compared to right, cannot discriminate gross objects, see how many fingers are held up greater than 6 inches from face, pain with extraocular movements.  He is alert and oriented x3.  He has hyperreflexive DTRs.  Overall coordinated movements although he complains of some difficulty with coordination, he complains of numbness and tingling of peripheral  hands and feet.  Psychiatric:        Mood and Affect: Mood normal.        Behavior: Behavior normal.    ED Results / Procedures / Treatments   Labs (all labs ordered are listed, but only abnormal results are displayed) Labs Reviewed  BASIC METABOLIC PANEL - Abnormal; Notable for the following components:      Result Value   Potassium 3.2 (*)    All other components within normal limits  RESP PANEL BY RT-PCR (FLU A&B, COVID) ARPGX2  CBC WITH DIFFERENTIAL/PLATELET    EKG None  Radiology MR Brain W and Wo Contrast  Result Date: 08/10/2021 CLINICAL DATA:  26 year old male with demyelinating disease on MRI brain and spine last month. Multiple sclerosis. EXAM: MRI HEAD AND ORBITS WITHOUT AND WITH CONTRAST TECHNIQUE: Multiplanar, multiecho pulse sequences of the brain and surrounding structures were obtained without and with intravenous contrast. Multiplanar, multiecho pulse sequences of the orbits and surrounding structures were obtained including fat saturation techniques, before and after intravenous contrast administration. CONTRAST:  76mL GADAVIST GADOBUTROL 1 MMOL/ML IV SOLN COMPARISON:  Brain MRI 07/05/2021. Cervical and thoracic MRI today reported separately. FINDINGS: MRI HEAD FINDINGS Brain: Of the multiple scattered small foci of abnormal enhancement seen on MRI last month, small lesions in the anterior corona radiata (series 27, image 35 today), near the posterior right body of the corpus callosum (same image), along the right parietooccipital sulcus persist. The left cerebellar enhancement has resolved. But there are multiple new small foci of abnormal enhancement scattered in both cerebral hemispheres (series 27), with approximately eleven new areas of involvement identified (including 2 in the cerebellum which are better demonstrated on the dedicated orbit images, series 20). Associated abnormal DWI in those areas. T2 and FLAIR hyperintensity but no vasogenic edema or mass effect.  Underlying extensive bilateral periventricular white matter T2 and FLAIR hyperintensity elsewhere has not significantly changed. Involvement of the posterior limbs left deep white matter capsules and left lateral thalamus as before. Involvement of the left cerebellar peduncle, right lateral pons. And small scattered involvement of the cerebellar hemispheres. No superimposed restricted diffusion suggestive of acute infarction. No dural thickening. No midline shift, mass effect, evidence of mass lesion, ventriculomegaly, extra-axial collection or acute intracranial hemorrhage. Cervicomedullary junction and pituitary are within normal limits. No chronic cerebral blood products. No definite cortical encephalomalacia. Vascular: Major intracranial vascular flow voids are stable. The major dural venous sinuses are enhancing and appear to be patent. Skull and upper cervical spine: Dedicated cervical spine MRI today reported separately. Visualized bone marrow signal is within normal limits. Other: Mastoid air cells remain clear. Visible internal auditory structures appear  normal. MRI ORBITS FINDINGS Orbits: Normal suprasellar cistern, but there is asymmetric T2 hyperintensity in the left lateral optic chiasm (series 17, image 6). No enhancement there, but the prechiasmatic left optic nerve is enlarged, T2 hyperintense, and intensely enhancing intermittently along its course to the globe. See series 17, image 12, series 19, images 9 and 13. Contralateral right optic nerve remains within normal limits. Cavernous sinuses are within normal limits. Globes are intact. No other abnormal intraorbital enhancement or inflammation. Visualized sinuses: Ongoing left maxillary sinus inflammation with mucosal thickening and trapped secretions. Other Visualized paranasal sinuses and mastoids are stable and well aerated. Soft tissues: Negative visible noncontrast deep soft tissue spaces of the face. IMPRESSION: 1. Left side Acute Optic  Neuritis, and widespread small foci of Acute Demyelination scattered in both cerebral and cerebellar hemispheres. Some of the active demyelination in December is now quiescent, but approximately 11 new foci of enhancing demyelination are identified today. 2. No associated vasogenic edema or intracranial mass effect. 3. Dedicated spine MRI today reported separately. Electronically Signed   By: Genevie Ann M.D.   On: 08/10/2021 12:23   MR Cervical Spine W or Wo Contrast  Result Date: 08/10/2021 CLINICAL DATA:  Multiple sclerosis. EXAM: MRI CERVICAL AND THORACIC SPINE WITHOUT AND WITH CONTRAST TECHNIQUE: Multiplanar and multiecho pulse sequences of the cervical spine, to include the craniocervical junction and cervicothoracic junction, and the thoracic spine, were obtained without and with intravenous contrast. CONTRAST:  36mL GADAVIST GADOBUTROL 1 MMOL/ML IV SOLN COMPARISON:  MRI cervical and thoracic spine 07/05/2021 FINDINGS: MRI CERVICAL SPINE FINDINGS Alignment: Normal Vertebrae: Normal bone marrow.  Negative for fracture or mass Cord: Multiple cord lesions compatible with multiple sclerosis. Mild improvement in cord lesions at C2 and C3. Improvement in the lesion at C6-7 which is less apparent on STIR and no longer shows enhancement New lesion at the C5 level centrally located and with diffuse enhancement. Posterior Fossa, vertebral arteries, paraspinal tissues: Negative Disc levels: Normal disc spaces.  No spinal stenosis. MRI THORACIC SPINE FINDINGS Alignment:  Normal Vertebrae: Normal bone marrow.  Negative for fracture or mass. Cord: Multiple cord lesions are present consistent with multiple sclerosis. Lesions are less apparent on STIR. Interval resolution of cord enhancement in the T2-3 level. Mild enhancement at the T9 level also has resolved. No new enhancing lesion identified. Resolution of mild enhancement in the T11 lesion. Paraspinal and other soft tissues: Negative Disc levels: Normal IMPRESSION: 1.  Mixed response in the cervical spinal cord. Improvement is several lesions however there is a new enhancing lesion at C5-6 compatible with active demyelinization 2. Improved lesions in thoracic cord. Lesions are less apparent with resolution of enhancing lesions at T2-T3, T9, and T11. Electronically Signed   By: Franchot Gallo M.D.   On: 08/10/2021 12:45   MR THORACIC SPINE W WO CONTRAST  Result Date: 08/10/2021 CLINICAL DATA:  Multiple sclerosis. EXAM: MRI CERVICAL AND THORACIC SPINE WITHOUT AND WITH CONTRAST TECHNIQUE: Multiplanar and multiecho pulse sequences of the cervical spine, to include the craniocervical junction and cervicothoracic junction, and the thoracic spine, were obtained without and with intravenous contrast. CONTRAST:  41mL GADAVIST GADOBUTROL 1 MMOL/ML IV SOLN COMPARISON:  MRI cervical and thoracic spine 07/05/2021 FINDINGS: MRI CERVICAL SPINE FINDINGS Alignment: Normal Vertebrae: Normal bone marrow.  Negative for fracture or mass Cord: Multiple cord lesions compatible with multiple sclerosis. Mild improvement in cord lesions at C2 and C3. Improvement in the lesion at C6-7 which is less apparent on STIR and no longer  shows enhancement New lesion at the C5 level centrally located and with diffuse enhancement. Posterior Fossa, vertebral arteries, paraspinal tissues: Negative Disc levels: Normal disc spaces.  No spinal stenosis. MRI THORACIC SPINE FINDINGS Alignment:  Normal Vertebrae: Normal bone marrow.  Negative for fracture or mass. Cord: Multiple cord lesions are present consistent with multiple sclerosis. Lesions are less apparent on STIR. Interval resolution of cord enhancement in the T2-3 level. Mild enhancement at the T9 level also has resolved. No new enhancing lesion identified. Resolution of mild enhancement in the T11 lesion. Paraspinal and other soft tissues: Negative Disc levels: Normal IMPRESSION: 1. Mixed response in the cervical spinal cord. Improvement is several lesions  however there is a new enhancing lesion at C5-6 compatible with active demyelinization 2. Improved lesions in thoracic cord. Lesions are less apparent with resolution of enhancing lesions at T2-T3, T9, and T11. Electronically Signed   By: Franchot Gallo M.D.   On: 08/10/2021 12:45   MR ORBITS W WO CONTRAST  Result Date: 08/10/2021 CLINICAL DATA:  26 year old male with demyelinating disease on MRI brain and spine last month. Multiple sclerosis. EXAM: MRI HEAD AND ORBITS WITHOUT AND WITH CONTRAST TECHNIQUE: Multiplanar, multiecho pulse sequences of the brain and surrounding structures were obtained without and with intravenous contrast. Multiplanar, multiecho pulse sequences of the orbits and surrounding structures were obtained including fat saturation techniques, before and after intravenous contrast administration. CONTRAST:  78mL GADAVIST GADOBUTROL 1 MMOL/ML IV SOLN COMPARISON:  Brain MRI 07/05/2021. Cervical and thoracic MRI today reported separately. FINDINGS: MRI HEAD FINDINGS Brain: Of the multiple scattered small foci of abnormal enhancement seen on MRI last month, small lesions in the anterior corona radiata (series 27, image 35 today), near the posterior right body of the corpus callosum (same image), along the right parietooccipital sulcus persist. The left cerebellar enhancement has resolved. But there are multiple new small foci of abnormal enhancement scattered in both cerebral hemispheres (series 27), with approximately eleven new areas of involvement identified (including 2 in the cerebellum which are better demonstrated on the dedicated orbit images, series 20). Associated abnormal DWI in those areas. T2 and FLAIR hyperintensity but no vasogenic edema or mass effect. Underlying extensive bilateral periventricular white matter T2 and FLAIR hyperintensity elsewhere has not significantly changed. Involvement of the posterior limbs left deep white matter capsules and left lateral thalamus as before.  Involvement of the left cerebellar peduncle, right lateral pons. And small scattered involvement of the cerebellar hemispheres. No superimposed restricted diffusion suggestive of acute infarction. No dural thickening. No midline shift, mass effect, evidence of mass lesion, ventriculomegaly, extra-axial collection or acute intracranial hemorrhage. Cervicomedullary junction and pituitary are within normal limits. No chronic cerebral blood products. No definite cortical encephalomalacia. Vascular: Major intracranial vascular flow voids are stable. The major dural venous sinuses are enhancing and appear to be patent. Skull and upper cervical spine: Dedicated cervical spine MRI today reported separately. Visualized bone marrow signal is within normal limits. Other: Mastoid air cells remain clear. Visible internal auditory structures appear normal. MRI ORBITS FINDINGS Orbits: Normal suprasellar cistern, but there is asymmetric T2 hyperintensity in the left lateral optic chiasm (series 17, image 6). No enhancement there, but the prechiasmatic left optic nerve is enlarged, T2 hyperintense, and intensely enhancing intermittently along its course to the globe. See series 17, image 12, series 19, images 9 and 13. Contralateral right optic nerve remains within normal limits. Cavernous sinuses are within normal limits. Globes are intact. No other abnormal intraorbital enhancement or inflammation.  Visualized sinuses: Ongoing left maxillary sinus inflammation with mucosal thickening and trapped secretions. Other Visualized paranasal sinuses and mastoids are stable and well aerated. Soft tissues: Negative visible noncontrast deep soft tissue spaces of the face. IMPRESSION: 1. Left side Acute Optic Neuritis, and widespread small foci of Acute Demyelination scattered in both cerebral and cerebellar hemispheres. Some of the active demyelination in December is now quiescent, but approximately 11 new foci of enhancing demyelination are  identified today. 2. No associated vasogenic edema or intracranial mass effect. 3. Dedicated spine MRI today reported separately. Electronically Signed   By: Odessa FlemingH  Hall M.D.   On: 08/10/2021 12:23    Procedures Procedures    Medications Ordered in ED Medications  potassium chloride SA (KLOR-CON M) CR tablet 40 mEq (has no administration in time range)  methylPREDNISolone sodium succinate (SOLU-MEDROL) 1,000 mg in sodium chloride 0.9 % 50 mL IVPB (1,000 mg Intravenous New Bag/Given 08/10/21 1202)  gadobutrol (GADAVIST) 1 MMOL/ML injection 10 mL (10 mLs Intravenous Contrast Given 08/10/21 1127)  gadobutrol (GADAVIST) 1 MMOL/ML injection 10 mL (10 mLs Intravenous Contrast Given 08/10/21 1128)  gadobutrol (GADAVIST) 1 MMOL/ML injection 10 mL (10 mLs Intravenous Contrast Given 08/10/21 1131)  gadobutrol (GADAVIST) 1 MMOL/ML injection 10 mL (10 mLs Intravenous Contrast Given 08/10/21 1152)    ED Course/ Medical Decision Making/ A&P Clinical Course as of 08/10/21 1323  Sun Aug 10, 2021  [id number] Derry LoryKhaliqdina [CP]    Clinical Course User Index [CP] Olene FlossProsperi, Michaelpaul Apo H, PA-C                           Medical Decision Making Amount and/or Complexity of Data Reviewed Labs: ordered. Radiology: ordered.  Risk Prescription drug management.   I discussed this case with my attending physician who cosigned this note including patient's presenting symptoms, physical exam, and planned diagnostics and interventions. Attending physician stated agreement with plan or made changes to plan which were implemented.   Attending physician assessed patient at bedside.  Patient presenting with new ataxia, tingling in hands, feet, and rapidly progressing loss of vision in left eye with pain.  Differential diagnosis includes possible progressive MS with optic neuritis versus other acute intracranial insult is other.  This is not an exhaustive differential.  I do have a high clinical suspicion that this is related to  patient's MS however as this was a recent diagnosis, and patient symptoms are very consistent with an optic neuritis at this time.  Consulted with Dr. Derry LoryKhaliqdina with neurology who has concern for new progression of symptoms, new lesions, requests MRI brain, MR cervical spine, MR thoracic spine, and MRI orbits with and without contrast.  Reports that vision loss is a serious new complaints, and he would recommend further evaluation to see if there is a precipitating factor for these changes versus if new lesions noted on MRI, would recommend inpatient hospitalization as if patient's vision is not improving after the first few doses of IV steroids he would recommend escalation to further treatment.  Personally ordered, reviewed, and independently interpreted MRI brain, MR orbits, MRI thoracic spine, MRI cervical spine is reviewed.  These imaging studies show new active optic neuritis, evidence of remitting lesions from previous scan, however there are several new foci of hyperintensity consistent with MS.  I agree with the radiologist interpretation.  Additionally reviewed screening lab work including BMP, CBC, RVP which are unremarkable other than mild hypokalemia of 3.2.  We will replete orally.  Reconsulted  with Dr. Lorrin Goodell who recommends admission at this time for high-dose IV steroids for 5 days, with escalation to [place] at day 4 if patient not having significant improvement of symptoms.  Patient still pending medication to be on to control his symptoms long-term.  At this time consulted with hospitalist for admission for steroid infusion, with neurology consult, Dr. Fuller Plan agrees for admission at this time.  Patient informed of plan.   Addendum: After discussion with the hospitalist, and neurology service patient does not wish to stay for admission at this time.  Per neurology he is to begin high-dose oral prednisone, and follow-up with Dr. Felecia Shelling in the morning.  Had a discussion with patient  about the benefits of admission, and he still requests to be discharged.  We will discharge with high-dose prednisone, as well as he requested a refill of his Crestor.  Patient discharged in stable condition at this time, strongly encouraged to follow-up with neurology.  He understands and agrees to this plan. Final Clinical Impression(s) / ED Diagnoses Final diagnoses:  None    Rx / DC Orders ED Discharge Orders     None         Anselmo Pickler, PA-C 08/10/21 1324    Dorien Chihuahua 08/10/21 1507    Davonna Belling, MD 08/10/21 1524

## 2021-08-10 NOTE — Telephone Encounter (Signed)
°  I received A PHONE CALL ON SATURDAY NIGHT, 08-09-2021, BY PATIENT'S MOTHER : The first of many throughout the night. : The first phone call reached me at 7:15 PM answering service stated that caller is concerned about her son losing vision - having blurred vision and eye pain. But I responded to the phone call I asked who is patient Willie Archer and his mother onset that she is followed by Dr. Epimenio Foot.  I assumed that this was a case of optic neuritis rather acute onset.  I was not close to any computer at the time and told the mother that I would be in transferring her conversation with me in the morning.  I recommended to go to the hospital's emergency room, not an urgent care, to get 1 g of Solu-Medrol IV.  I recommended to go to [facility] emergency room where a neuro hospitalist would also be on-call.  I explained that oral medication at an effective high dose is difficult to administer, plus there is no pharmacy open at this time and the earliest would be p.o. medication the next morning which would be a Sunday.  By midnight there is still motion rating to be seen.  The answering service) my house phone at 2 AM because the patient still had not been seen and became agitated. Willie Archer indicated that there was nothing moving "" in the waiting room and that no timeframe could be given when he will be seen.  I had in my previous conversation with her recommended on Monday morning to call Dr. Epimenio Foot to make sure that the patient will be seen.  I just learned that the patient's symptom actually were not that acute and had started 7 or 8 days prior to her phone call.  Willie Archer also was under the impression that I could call in a new MS specific medications which I cannot.  In our 2 AM conversation I asked her again to call on Monday morning at [facility] neurologic.   I will call and now an oral medication- prednisone  po, which may not be as effective but I truly do not know what else to  offer.  All conversation was through the patient's mother, not the patient.Marland Kitchen

## 2021-08-10 NOTE — ED Notes (Signed)
Pt requesting to leave and have workup done outpt due to being in hallway. MD made aware.

## 2021-08-10 NOTE — H&P (Deleted)
History and Physical    Willie Archer NIO:[id number] DOB: 1996/03/20 DOA: 08/09/2021  Referring MD/NP/PA: Dionisio David, PA-C PCP: Asa Lente, MD  Patient coming from: home  Chief Complaint: Left eye pain and blurry vision  I have personally briefly reviewed patient's old medical records in [facility]   HPI: Willie Archer is a 26 y.o. male with medical history significant of newly diagnosed multiple sclerosis 06/2021, hyperlipidemia, and vitamin D deficiency presents with complaints of left eye pain and blurry vision.  Symptoms started about 10-11 days ago and patient noted eye discomfort on looking either left or right.  He is able to verbalize that the blurry vision was coming just from his left eye 3 days ago.  He had just recently been hospitalized 12/16-12/21/2022 diagnosed with MS by MRI.  Work-up include ACE level, HIV, Lyme titer, and NMO antibodies were negative at that time.  He had followed up in outpatient setting with Dr. Epimenio Foot of neurology on 1/10.  Review of records note that blood work was obtained and it was planned for him to possibly be started on Tysabri, Ocrevus, or oral S1 P2 receptor modulator. Patient notes that since that time he has had associated symptoms of decreased sensation in his hands and feet for which he is off balance at times.  Denies having any significant falls.  He notes that he has a relatively on his mother side that has MS and is pretty debilitated.  ED Course: On admission into the emergency department patient was seen to be afebrile with blood pressures elevated to 150/105, and all other vital signs maintained.  Labs significant for potassium 3.2.  Influenza and COVID-19 screening were negative.  Neurology have been formally consulted.  MRI of the brain, orbits, cervical, and thoracic spine have been obtained.  Imaging showed left-sided acute optic neuritis,  widespread small foci of acute demyelination scattered in both cerebral and  cerebellar hemispheres with 11 new foci, mixed response in the cervical cord with improvement in several lesions with new enhancing lesions C5-C6 active demyelinization, and improvement in lesions of the thoracic region.  Patient had been started on Solu-Medrol 1 g.  TRH called to admit.  Review of Systems  Constitutional:  Negative for fever and malaise/fatigue.  HENT:  Negative for congestion and nosebleeds.   Eyes:  Positive for blurred vision and pain.  Respiratory:  Negative for cough and shortness of breath.   Cardiovascular:  Negative for chest pain and leg swelling.  Gastrointestinal:  Negative for abdominal pain, nausea and vomiting.  Genitourinary:  Negative for dysuria and hematuria.  Musculoskeletal:  Negative for falls and myalgias.  Neurological:  Negative for loss of consciousness and headaches.  Psychiatric/Behavioral:  Negative for substance abuse.    Past Medical History:  Diagnosis Date   Hyperlipidemia    MS (multiple sclerosis) (HCC)    Vitamin D deficiency     Past Surgical History:  Procedure Laterality Date   ACHILLES TENDON SURGERY Bilateral 2007   Dr Cheryll Cockayne     reports that he has never smoked. He has never used smokeless tobacco. He reports current alcohol use. He reports that he does not use drugs.  No Known Allergies  Family History  Problem Relation Age of Onset   Diabetes Mother    Diabetes Father     Prior to Admission medications   Medication Sig Start Date End Date Taking? Authorizing Provider  Cyanocobalamin (B-12 PO) Take 1 tablet by mouth daily.  [provider]  Multiple Vitamin (MULTIVITAMIN ADULT PO) Take 1 tablet by mouth daily.    [provider]  predniSONE (DELTASONE) 20 MG tablet Take 20 tabs ( 400 mg)  po after breakfast on 08-11-2021 and another 10 tabs on Monday after breakfast bu mouth. 08/10/21   Dohmeier, Asencion Partridge, MD  rosuvastatin (CRESTOR) 40 MG tablet Take 40 mg by mouth daily.    [provider]   traZODone (DESYREL) 50 MG tablet Take 1 tablet (50 mg total) by mouth at bedtime. 07/29/21   Sater, Nanine Means, MD  Vitamin D, Ergocalciferol, (DRISDOL) 1.25 MG (50000 UNIT) CAPS capsule Take 1 capsule (50,000 Units total) by mouth every 7 (seven) days. 07/14/21   Mercy Riding, MD    Physical Exam:  Constitutional: NAD, calm, comfortable Vitals:   08/10/21 0656 08/10/21 0732 08/10/21 0734 08/10/21 1204  BP: (!) 143/96 (!) 141/90 (!) 141/90 131/87  Pulse: 70 88 88 77  Resp: 16 12 12 15   Temp:  98 F (36.7 C) 98 F (36.7 C)   TempSrc:   Oral   SpO2: 100% 100% 100% 96%  Weight:      Height:       Eyes: PERRL, lids and conjunctivae normal ENMT: Mucous membranes are moist. Posterior pharynx clear of any exudate or lesions.Normal dentition.  Neck: normal, supple, no masses, no thyromegaly Respiratory: clear to auscultation bilaterally, no wheezing, no crackles. Normal respiratory effort. No accessory muscle use.  Cardiovascular: Regular rate and rhythm, no murmurs / rubs / gallops. No extremity edema. 2+ pedal pulses. No carotid bruits.  Abdomen: no tenderness, no masses palpated. No hepatosplenomegaly. Bowel sounds positive.  Musculoskeletal: no clubbing / cyanosis. No joint deformity upper and lower extremities. Good ROM, no contractures. Normal muscle tone.  Skin: no rashes, lesions, ulcers. No induration Neurologic: Decreased vision of the left eye. Psychiatric: Normal judgment and insight. Alert and oriented x 3. Normal mood.     Labs on Admission: I have personally reviewed following labs and imaging studies  CBC: Recent Labs  Lab 08/10/21 0754  WBC 8.8  NEUTROABS 4.5  HGB 14.1  HCT 42.2  MCV 80.5  PLT [id number]   Basic Metabolic Panel: Recent Labs  Lab 08/10/21 0754  NA 139  K 3.2*  CL 103  CO2 25  GLUCOSE 80  BUN 14  CREATININE 0.89  CALCIUM 9.4   GFR: Estimated Creatinine Clearance: 139.7 mL/min (by C-G formula based on SCr of 0.89 mg/dL). Liver Function  Tests: No results for input(s): AST, ALT, ALKPHOS, BILITOT, PROT, ALBUMIN in the last 168 hours. No results for input(s): LIPASE, AMYLASE in the last 168 hours. No results for input(s): AMMONIA in the last 168 hours. Coagulation Profile: No results for input(s): INR, PROTIME in the last 168 hours. Cardiac Enzymes: No results for input(s): CKTOTAL, CKMB, CKMBINDEX, TROPONINI in the last 168 hours. BNP (last 3 results) No results for input(s): PROBNP in the last 8760 hours. HbA1C: No results for input(s): HGBA1C in the last 72 hours. CBG: No results for input(s): GLUCAP in the last 168 hours. Lipid Profile: No results for input(s): CHOL, HDL, LDLCALC, TRIG, CHOLHDL, LDLDIRECT in the last 72 hours. Thyroid Function Tests: No results for input(s): TSH, T4TOTAL, FREET4, T3FREE, THYROIDAB in the last 72 hours. Anemia Panel: No results for input(s): VITAMINB12, FOLATE, FERRITIN, TIBC, IRON, RETICCTPCT in the last 72 hours. Urine analysis:    Component Value Date/Time   COLORURINE CANCELED 03/20/2016 1115   APPEARANCEUR CANCELED 03/20/2016 1115  LABSPEC CANCELED 03/20/2016 1115   PHURINE CANCELED 03/20/2016 1115   GLUCOSEU CANCELED 03/20/2016 1115   HGBUR CANCELED 03/20/2016 1115   BILIRUBINUR CANCELED 03/20/2016 1115   KETONESUR CANCELED 03/20/2016 1115   PROTEINUR CANCELED 03/20/2016 1115   NITRITE CANCELED 03/20/2016 1115   LEUKOCYTESUR CANCELED 03/20/2016 1115   Sepsis Labs: Recent Results (from the past 240 hour(s))  Resp Panel by RT-PCR (Flu A&B, Covid) Nasopharyngeal Swab     Status: None   Collection Time: 08/10/21  8:56 AM   Specimen: Nasopharyngeal Swab; Nasopharyngeal(NP) swabs in vial transport medium  Result Value Ref Range Status   SARS Coronavirus 2 by RT PCR NEGATIVE NEGATIVE Final    Comment: (NOTE) SARS-CoV-2 target nucleic acids are NOT DETECTED.  The SARS-CoV-2 RNA is generally detectable in upper respiratory specimens during the acute phase of infection.  The lowest concentration of SARS-CoV-2 viral copies this assay can detect is 138 copies/mL. A negative result does not preclude SARS-Cov-2 infection and should not be used as the sole basis for treatment or other patient management decisions. A negative result may occur with  improper specimen collection/handling, submission of specimen other than nasopharyngeal swab, presence of viral mutation(s) within the areas targeted by this assay, and inadequate number of viral copies(<138 copies/mL). A negative result must be combined with clinical observations, patient history, and epidemiological information. The expected result is Negative.  Fact Sheet for Patients:  EntrepreneurPulse.com.au  Fact Sheet for Healthcare Providers:  IncredibleEmployment.be  This test is no t yet approved or cleared by the [place] FDA and  has been authorized for detection and/or diagnosis of SARS-CoV-2 by FDA under an Emergency Use Authorization (EUA). This EUA will remain  in effect (meaning this test can be used) for the duration of the COVID-19 declaration under Section 564(b)(1) of the Act, 21 U.S.C.section 360bbb-3(b)(1), unless the authorization is terminated  or revoked sooner.       Influenza A by PCR NEGATIVE NEGATIVE Final   Influenza B by PCR NEGATIVE NEGATIVE Final    Comment: (NOTE) The Xpert Xpress SARS-CoV-2/FLU/RSV plus assay is intended as an aid in the diagnosis of influenza from Nasopharyngeal swab specimens and should not be used as a sole basis for treatment. Nasal washings and aspirates are unacceptable for Xpert Xpress SARS-CoV-2/FLU/RSV testing.  Fact Sheet for Patients: EntrepreneurPulse.com.au  Fact Sheet for Healthcare Providers: IncredibleEmployment.be  This test is not yet approved or cleared by the [place] FDA and has been authorized for detection and/or diagnosis of SARS-CoV-2 by FDA  under an Emergency Use Authorization (EUA). This EUA will remain in effect (meaning this test can be used) for the duration of the COVID-19 declaration under Section 564(b)(1) of the Act, 21 U.S.C. section 360bbb-3(b)(1), unless the authorization is terminated or revoked.  Performed at [facility], [place] [address] [place], [place] [address]      Radiological Exams on Admission: MR Brain W and Wo Contrast  Result Date: 08/10/2021 CLINICAL DATA:  26 year old male with demyelinating disease on MRI brain and spine last month. Multiple sclerosis. EXAM: MRI HEAD AND ORBITS WITHOUT AND WITH CONTRAST TECHNIQUE: Multiplanar, multiecho pulse sequences of the brain and surrounding structures were obtained without and with intravenous contrast. Multiplanar, multiecho pulse sequences of the orbits and surrounding structures were obtained including fat saturation techniques, before and after intravenous contrast administration. CONTRAST:  35mL GADAVIST GADOBUTROL 1 MMOL/ML IV SOLN COMPARISON:  Brain MRI 07/05/2021. Cervical and thoracic MRI today reported separately. FINDINGS: MRI HEAD FINDINGS Brain:  Of the multiple scattered small foci of abnormal enhancement seen on MRI last month, small lesions in the anterior corona radiata (series 27, image 35 today), near the posterior right body of the corpus callosum (same image), along the right parietooccipital sulcus persist. The left cerebellar enhancement has resolved. But there are multiple new small foci of abnormal enhancement scattered in both cerebral hemispheres (series 27), with approximately eleven new areas of involvement identified (including 2 in the cerebellum which are better demonstrated on the dedicated orbit images, series 20). Associated abnormal DWI in those areas. T2 and FLAIR hyperintensity but no vasogenic edema or mass effect. Underlying extensive bilateral periventricular white matter T2 and FLAIR hyperintensity elsewhere has not  significantly changed. Involvement of the posterior limbs left deep white matter capsules and left lateral thalamus as before. Involvement of the left cerebellar peduncle, right lateral pons. And small scattered involvement of the cerebellar hemispheres. No superimposed restricted diffusion suggestive of acute infarction. No dural thickening. No midline shift, mass effect, evidence of mass lesion, ventriculomegaly, extra-axial collection or acute intracranial hemorrhage. Cervicomedullary junction and pituitary are within normal limits. No chronic cerebral blood products. No definite cortical encephalomalacia. Vascular: Major intracranial vascular flow voids are stable. The major dural venous sinuses are enhancing and appear to be patent. Skull and upper cervical spine: Dedicated cervical spine MRI today reported separately. Visualized bone marrow signal is within normal limits. Other: Mastoid air cells remain clear. Visible internal auditory structures appear normal. MRI ORBITS FINDINGS Orbits: Normal suprasellar cistern, but there is asymmetric T2 hyperintensity in the left lateral optic chiasm (series 17, image 6). No enhancement there, but the prechiasmatic left optic nerve is enlarged, T2 hyperintense, and intensely enhancing intermittently along its course to the globe. See series 17, image 12, series 19, images 9 and 13. Contralateral right optic nerve remains within normal limits. Cavernous sinuses are within normal limits. Globes are intact. No other abnormal intraorbital enhancement or inflammation. Visualized sinuses: Ongoing left maxillary sinus inflammation with mucosal thickening and trapped secretions. Other Visualized paranasal sinuses and mastoids are stable and well aerated. Soft tissues: Negative visible noncontrast deep soft tissue spaces of the face. IMPRESSION: 1. Left side Acute Optic Neuritis, and widespread small foci of Acute Demyelination scattered in both cerebral and cerebellar  hemispheres. Some of the active demyelination in December is now quiescent, but approximately 11 new foci of enhancing demyelination are identified today. 2. No associated vasogenic edema or intracranial mass effect. 3. Dedicated spine MRI today reported separately. Electronically Signed   By: Genevie Ann M.D.   On: 08/10/2021 12:23   MR Cervical Spine W or Wo Contrast  Result Date: 08/10/2021 CLINICAL DATA:  Multiple sclerosis. EXAM: MRI CERVICAL AND THORACIC SPINE WITHOUT AND WITH CONTRAST TECHNIQUE: Multiplanar and multiecho pulse sequences of the cervical spine, to include the craniocervical junction and cervicothoracic junction, and the thoracic spine, were obtained without and with intravenous contrast. CONTRAST:  44mL GADAVIST GADOBUTROL 1 MMOL/ML IV SOLN COMPARISON:  MRI cervical and thoracic spine 07/05/2021 FINDINGS: MRI CERVICAL SPINE FINDINGS Alignment: Normal Vertebrae: Normal bone marrow.  Negative for fracture or mass Cord: Multiple cord lesions compatible with multiple sclerosis. Mild improvement in cord lesions at C2 and C3. Improvement in the lesion at C6-7 which is less apparent on STIR and no longer shows enhancement New lesion at the C5 level centrally located and with diffuse enhancement. Posterior Fossa, vertebral arteries, paraspinal tissues: Negative Disc levels: Normal disc spaces.  No spinal stenosis. MRI THORACIC SPINE FINDINGS Alignment:  Normal Vertebrae: Normal bone marrow.  Negative for fracture or mass. Cord: Multiple cord lesions are present consistent with multiple sclerosis. Lesions are less apparent on STIR. Interval resolution of cord enhancement in the T2-3 level. Mild enhancement at the T9 level also has resolved. No new enhancing lesion identified. Resolution of mild enhancement in the T11 lesion. Paraspinal and other soft tissues: Negative Disc levels: Normal IMPRESSION: 1. Mixed response in the cervical spinal cord. Improvement is several lesions however there is a new  enhancing lesion at C5-6 compatible with active demyelinization 2. Improved lesions in thoracic cord. Lesions are less apparent with resolution of enhancing lesions at T2-T3, T9, and T11. Electronically Signed   By: Franchot Gallo M.D.   On: 08/10/2021 12:45   MR THORACIC SPINE W WO CONTRAST  Result Date: 08/10/2021 CLINICAL DATA:  Multiple sclerosis. EXAM: MRI CERVICAL AND THORACIC SPINE WITHOUT AND WITH CONTRAST TECHNIQUE: Multiplanar and multiecho pulse sequences of the cervical spine, to include the craniocervical junction and cervicothoracic junction, and the thoracic spine, were obtained without and with intravenous contrast. CONTRAST:  70mL GADAVIST GADOBUTROL 1 MMOL/ML IV SOLN COMPARISON:  MRI cervical and thoracic spine 07/05/2021 FINDINGS: MRI CERVICAL SPINE FINDINGS Alignment: Normal Vertebrae: Normal bone marrow.  Negative for fracture or mass Cord: Multiple cord lesions compatible with multiple sclerosis. Mild improvement in cord lesions at C2 and C3. Improvement in the lesion at C6-7 which is less apparent on STIR and no longer shows enhancement New lesion at the C5 level centrally located and with diffuse enhancement. Posterior Fossa, vertebral arteries, paraspinal tissues: Negative Disc levels: Normal disc spaces.  No spinal stenosis. MRI THORACIC SPINE FINDINGS Alignment:  Normal Vertebrae: Normal bone marrow.  Negative for fracture or mass. Cord: Multiple cord lesions are present consistent with multiple sclerosis. Lesions are less apparent on STIR. Interval resolution of cord enhancement in the T2-3 level. Mild enhancement at the T9 level also has resolved. No new enhancing lesion identified. Resolution of mild enhancement in the T11 lesion. Paraspinal and other soft tissues: Negative Disc levels: Normal IMPRESSION: 1. Mixed response in the cervical spinal cord. Improvement is several lesions however there is a new enhancing lesion at C5-6 compatible with active demyelinization 2. Improved  lesions in thoracic cord. Lesions are less apparent with resolution of enhancing lesions at T2-T3, T9, and T11. Electronically Signed   By: Franchot Gallo M.D.   On: 08/10/2021 12:45   MR ORBITS W WO CONTRAST  Result Date: 08/10/2021 CLINICAL DATA:  26 year old male with demyelinating disease on MRI brain and spine last month. Multiple sclerosis. EXAM: MRI HEAD AND ORBITS WITHOUT AND WITH CONTRAST TECHNIQUE: Multiplanar, multiecho pulse sequences of the brain and surrounding structures were obtained without and with intravenous contrast. Multiplanar, multiecho pulse sequences of the orbits and surrounding structures were obtained including fat saturation techniques, before and after intravenous contrast administration. CONTRAST:  33mL GADAVIST GADOBUTROL 1 MMOL/ML IV SOLN COMPARISON:  Brain MRI 07/05/2021. Cervical and thoracic MRI today reported separately. FINDINGS: MRI HEAD FINDINGS Brain: Of the multiple scattered small foci of abnormal enhancement seen on MRI last month, small lesions in the anterior corona radiata (series 27, image 35 today), near the posterior right body of the corpus callosum (same image), along the right parietooccipital sulcus persist. The left cerebellar enhancement has resolved. But there are multiple new small foci of abnormal enhancement scattered in both cerebral hemispheres (series 27), with approximately eleven new areas of involvement identified (including 2 in the cerebellum which are better demonstrated  on the dedicated orbit images, series 20). Associated abnormal DWI in those areas. T2 and FLAIR hyperintensity but no vasogenic edema or mass effect. Underlying extensive bilateral periventricular white matter T2 and FLAIR hyperintensity elsewhere has not significantly changed. Involvement of the posterior limbs left deep white matter capsules and left lateral thalamus as before. Involvement of the left cerebellar peduncle, right lateral pons. And small scattered involvement  of the cerebellar hemispheres. No superimposed restricted diffusion suggestive of acute infarction. No dural thickening. No midline shift, mass effect, evidence of mass lesion, ventriculomegaly, extra-axial collection or acute intracranial hemorrhage. Cervicomedullary junction and pituitary are within normal limits. No chronic cerebral blood products. No definite cortical encephalomalacia. Vascular: Major intracranial vascular flow voids are stable. The major dural venous sinuses are enhancing and appear to be patent. Skull and upper cervical spine: Dedicated cervical spine MRI today reported separately. Visualized bone marrow signal is within normal limits. Other: Mastoid air cells remain clear. Visible internal auditory structures appear normal. MRI ORBITS FINDINGS Orbits: Normal suprasellar cistern, but there is asymmetric T2 hyperintensity in the left lateral optic chiasm (series 17, image 6). No enhancement there, but the prechiasmatic left optic nerve is enlarged, T2 hyperintense, and intensely enhancing intermittently along its course to the globe. See series 17, image 12, series 19, images 9 and 13. Contralateral right optic nerve remains within normal limits. Cavernous sinuses are within normal limits. Globes are intact. No other abnormal intraorbital enhancement or inflammation. Visualized sinuses: Ongoing left maxillary sinus inflammation with mucosal thickening and trapped secretions. Other Visualized paranasal sinuses and mastoids are stable and well aerated. Soft tissues: Negative visible noncontrast deep soft tissue spaces of the face. IMPRESSION: 1. Left side Acute Optic Neuritis, and widespread small foci of Acute Demyelination scattered in both cerebral and cerebellar hemispheres. Some of the active demyelination in December is now quiescent, but approximately 11 new foci of enhancing demyelination are identified today. 2. No associated vasogenic edema or intracranial mass effect. 3. Dedicated  spine MRI today reported separately. Electronically Signed   By: Genevie Ann M.D.   On: 08/10/2021 12:23     Assessment/Plan Optic neuritis left eye  relapsing remitting multiple sclerosis: Acute.  Patient presents with complaints of blurry vision out of the left eye as well as pain started approximately 10-11 days ago. Imaging showed left-sided acute optic neuritis,  widespread small foci of acute demyelination scattered in both cerebral and cerebellar hemispheres with 11 new foci, mixed response in the cervical cord with improvement in several lesions with new enhancing lesions C5-C6 active demyelinization, and improvement in lesions of the thoracic region. -Admit to a MedSurg bed -Neurochecks -Solu-Medrol 1 g IV daily -PT  to evaluate and treat -Appreciate neurology consultative services, we will follow-up for any further recommendations  Hypokalemia: Potassium 3.2 on admission. -Give potassium chloride 40 mEq p.o. x1 dose -Continue to monitor and replace as needed  Hyperlipidemia -Continue Crestor  Vitamin D deficiency: Diagnosed during last hospitalization low vitamin D level 10.06. -Resume vitamin D supplementation and outpatient setting  Obesity: BMI 31.93 kg/m2  DVT prophylaxis: lovenox Code Status: Full Family Communication: None Disposition Plan: Hopefully discharge home once medically stable Consults called: Neurology Admission status: Inpatient require more than 2 midnight stay  Norval Morton MD [facility]   If 7PM-7AM, please contact night-coverage   08/10/2021, 1:10 PM

## 2021-08-11 ENCOUNTER — Telehealth: Payer: Self-pay

## 2021-08-11 LAB — IGG, IGA, IGM
IgA/Immunoglobulin A, Serum: 133 mg/dL (ref 90–386)
IgG (Immunoglobin G), Serum: 931 mg/dL (ref 603–1613)
IgM (Immunoglobulin M), Srm: 71 mg/dL (ref 20–172)

## 2021-08-11 LAB — STRATIFY JCV(TM) AB W/INDEX
JCV Antibody: POSITIVE — AB
JCV Index Value: 0.78

## 2021-08-11 LAB — QUANTIFERON-TB GOLD PLUS
QuantiFERON Mitogen Value: >10 IU/mL
QuantiFERON Nil Value: 0 IU/mL
QuantiFERON TB1 Ag Value: 0 IU/mL
QuantiFERON TB2 Ag Value: 0.03 IU/mL
QuantiFERON-TB Gold Plus: NEGATIVE

## 2021-08-11 LAB — VARICELLA ZOSTER ANTIBODY, IGG: Varicella zoster IgG: 1220 index (ref >165)

## 2021-08-11 LAB — HEPATITIS C ANTIBODY: Hep C Virus Ab: <0.1 s/co ratio (ref 0.0–0.9)

## 2021-08-11 LAB — HEPATITIS B SURFACE ANTIGEN: Hepatitis B Surface Ag: NEGATIVE

## 2021-08-11 LAB — HEPATITIS B SURFACE ANTIBODY,QUALITATIVE: Hep B Surface Ab, Qual: REACTIVE

## 2021-08-11 LAB — HEPATITIS B CORE ANTIBODY, TOTAL: Hep B Core Total Ab: NEGATIVE

## 2021-08-11 NOTE — Telephone Encounter (Signed)
-----   Message from Asa Lente, MD sent at 08/11/2021  2:26 PM EST ----- We finally got the rest of the labs.  Since the JCV is positive, I would prefer to have him start Ocrevus.  However, if he strongly prefers an oral agent over the IV we can start Zeposia.

## 2021-08-14 ENCOUNTER — Ambulatory Visit: Payer: 59

## 2021-08-18 NOTE — Telephone Encounter (Signed)
Zeposia Start form has been sent to [employer] support. Will await directions on Authorization

## 2021-08-21 ENCOUNTER — Other Ambulatory Visit: Payer: Self-pay | Admitting: *Deleted

## 2021-08-21 NOTE — Telephone Encounter (Signed)
Called [employer] support and spoke w/ Lenora Ref# [id number]. States from their records, nurse reached out to pt 08/20/21 at 4:22pm to schedule assessments. LVM for pt to call back. She is going to reach back out to pt today. They will do three attempts. After that, if pt unreachable, they will let our office know so we can reach out to the pt. Once member calls in and schedules exams (ECG/macular edema screening), he will be transferred over to benefit verification line to do benefit verification. They are considered third party and unable to contact insurance w/o pt consent. Open Mon-Fri 8-8pm EST. Recommend pt try to call before 4pm.

## 2021-08-21 NOTE — Patient Outreach (Signed)
[facility]) Care Management  08/21/2021  Willie Archer October 04, 1995 [id number]  Transition of care telephone call Referral received: 07/10/2021 4th outreach:08/21/2021 Insurance: [employer]/[facility] [employer]  Outreach #4 telephone call was unsuccessful to patient's listed telephone number, no answer received. RN left a HIPAA compliant voicemail message with contact information, requesting a return call.  Based upon the number of outreach calls and no response to the outreach letter sent to this pt will close this case at this time.   Elliot Cousin, RN Care Management Coordinator [facility] Main Office [phone]

## 2021-08-26 NOTE — Telephone Encounter (Signed)
Initiated zeposia 0.92mg  capsules via CMM. Sent to [employer]. Should have a determination within 1-3 business days. Key: [id number].

## 2021-08-26 NOTE — Telephone Encounter (Signed)
Received this notice from [place] at [id number]: " So, his assessments were scheduled for yesterday 2/6 but the appointment was changed.  I asked Z360 to let me know the date once the assessments are rescheduled.  I'll keep you posted!"

## 2021-09-01 NOTE — Telephone Encounter (Signed)
Received EKG and ME screening results from 2/6/203. EKG showed NSR. Eye exam did not show any apparent macular edema in either eye.  Results and clearance form for zeposia given to Dr. Epimenio Foot for review and signature upon his return to the office tomorrow.

## 2021-09-01 NOTE — Telephone Encounter (Signed)
Received this notice from CMM/[employer]: "This request has not been approved. Based on the information submitted for review, you did not meet our guideline rules for the requested drug. In order for your request to be approved, your provider would need to show that you have met the guideline rules below. The details below are written in medical language. If you have questions, please contact your provider. In some cases, the requested medication or alternatives offered may have additional approval requirements. Your provider requested Zeposia 0.32m capsules to manage your condition of a relapsing form of multiple sclerosis (MS: type of nerve disorder) to include clinically isolated syndrome (occurs once), relapsing-remitting disease (periods of symptoms and no symptoms), and active secondary progressive disease (advanced disease). When used for the treatment of a relapsing form of multiple sclerosis, our guideline named OZANIMIOD (Henrietta Dine requires that you have previously tried ONE sphingosine-1-phosphate receptor modulator (type of medication used to treat multiple sclerosis, such as Gilenya or Mayzent) AND any ONE medication indicated for the treatment of multiple sclerosis, unless there is a valid medical reason why you cannot. Please note that Gilenya, Mayzent, and other medications for multiple sclerosis may also require a prior authorization. This request was denied because we did not receive information that you meet the requirements listed above. Please work with your doctor to use a different medication or get [place] information if it will allow [employer] approve this request. A written notification letter will follow with additional details."  I have written an appeal letter and will ask Dr. SFelecia Shellingto review when he returns to the office tomorrow.

## 2021-09-02 NOTE — Telephone Encounter (Signed)
Dr. Epimenio Foot reviewed and signed appeal for Zeposia. Faxed to [employer] department. Received a receipt of confirmation.  Dr. Epimenio Foot reviewed and signed baseline assessment clearance to start Zeposia. Faxed to [id number]. Received a receipt of confirmation.

## 2021-09-08 NOTE — Telephone Encounter (Signed)
Appeal for Henrietta Dine was also denied by [employer].

## 2021-09-08 NOTE — Telephone Encounter (Signed)
I faxed PA and appeal denial for Zeposia to the Zeposia360 hub for consideration of patient assistance/free drug program. Received a receipt of confirmation.

## 2021-09-16 NOTE — Telephone Encounter (Signed)
Patient sent a mychart message reporting that he started zeposia and will let us know if he is unable to tolerate it going forward.

## 2021-09-22 DIAGNOSIS — Z0289 Encounter for other administrative examinations: Secondary | ICD-10-CM

## 2021-09-25 ENCOUNTER — Encounter: Payer: Self-pay | Admitting: Neurology

## 2021-09-29 NOTE — Telephone Encounter (Signed)
Gave completed/signed form back to medical records to process for pt. 

## 2021-11-27 DIAGNOSIS — Z0289 Encounter for other administrative examinations: Secondary | ICD-10-CM

## 2021-12-02 ENCOUNTER — Telehealth: Payer: Self-pay

## 2021-12-02 NOTE — Telephone Encounter (Signed)
Gave completed/signed form back to MR to process for pt. °

## 2021-12-02 NOTE — Telephone Encounter (Signed)
Return to work has been completed with effective date as 12/02/2021. Forms placed in Dr. Bonnita Hollow pod for review and signature if he agrees.  ?

## 2021-12-03 ENCOUNTER — Telehealth: Payer: Self-pay

## 2021-12-03 NOTE — Telephone Encounter (Signed)
Patients return to work paperwork completed and faxed ?

## 2021-12-05 ENCOUNTER — Other Ambulatory Visit (HOSPITAL_COMMUNITY): Payer: Self-pay

## 2021-12-08 ENCOUNTER — Encounter: Payer: Self-pay | Admitting: Neurology

## 2021-12-08 ENCOUNTER — Telehealth: Payer: Self-pay | Admitting: Neurology

## 2021-12-08 ENCOUNTER — Ambulatory Visit: Payer: 59 | Admitting: Neurology

## 2021-12-08 VITALS — BP 129/87 | HR 96 | Ht 68.0 in | Wt 211.0 lb

## 2021-12-08 DIAGNOSIS — Z79899 Other long term (current) drug therapy: Secondary | ICD-10-CM

## 2021-12-08 DIAGNOSIS — G35 Multiple sclerosis: Secondary | ICD-10-CM

## 2021-12-08 DIAGNOSIS — R269 Unspecified abnormalities of gait and mobility: Secondary | ICD-10-CM | POA: Diagnosis not present

## 2021-12-08 NOTE — Telephone Encounter (Signed)
Pt and pt's mother called asking if pt could be worked in/called back. He states his job is not allowing him to return to work. Pt is also experiencing worsening symptoms: balance issues, numbness. Would like a call back from nurse ASAP.

## 2021-12-08 NOTE — Telephone Encounter (Signed)
Error

## 2021-12-08 NOTE — Progress Notes (Signed)
[facility]  PATIENT: Willie Archer DOB: [birth year]  REFERRING DOCTOR OR PCP:  Unk Pinto, MD; Wendee Beavers, MD SOURCE: Patient, extensive notes from recent hospitalization, imaging and laboratory reports, multiple MRI scans were reviewed personally  _________________________________   HISTORICAL  CHIEF COMPLAINT:  Chief Complaint  Patient presents with   Follow-up    Rm 2, w mother. Here for re evaluation for job restrictions. Ppw needing to be filled out today. Pt has had in increase in eye pn and constant fatigue, neck pn, and balance issues.     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   UPDATE 12/08/2021: He started Zeposia in March and has not had any major exacerbation though he does continue to have fluctuating neurologic symptoms.    MS presented aggressively with 5 enhancing lesions on 06/2021 MRIs and multiple brainstem and spinal plaques.    I saw him early January 2023 and he had optic neuritis one week later (OS).  MRI at that time had shown additional enhancing lesions   He had preferred a pill to IV options  He gets fatigued easily and usually does worse when that occurs.   He has more fatigue with physical activity.  His gait is better than at presentation but he still stumbles and jerks some with his gait.   No more falls.   Legs feel symmetric to him though others have told him the left leg seems weaker when they watch him wa.     He continues to have numbness in his feet Bladder is doing ok with mild urgency at times.  He noted the vision improved after a week of steroids in January.  The left eye is now almost as strong as the right.  He has felt much more fatigued the past month.   He sleeps poorly due to sleep onset more than maintenance issues.   He only has 1 x nocturia  He notes some depression.   He notes no definite cognitive change.   He has noted some issues with word finding.     He is able  to work with physical accommodations.  He can do desk work.   He is still off balanced and feels he cannot do some physical tasks.   Specifically, he cannot balance.    He cannot lift above 20 pounds more than rarely  (once an hour or less) or above 5-10 pounds frequently.       MS History: He is a 26 year old man who was diagnosed with MS December 2022 after presenting with leg weakness and difficulty walking.  He presented to the emergency room 07/04/2021 with 4 days of worsening numbness, weakness and gait issues.  He did have an upper respiratory infection about 2 weeks before the onset of symptoms.  He was admitted.  In initially, he was diagnosed with GBS and IVIG was started.  However, when noted to have hyperreflexia and abnormal signal in the lower spinal cord seen on lumbar MRI, a demyelinating disease was felt to be more likely and he had further MRI studies performed and was placed on IV steroids.  They were consistent with MS.   He received 5 days of IV Solu-medrol.   He notes improvement but not to baseline.    He is doing PT.    His right side is clumsier and weaker than his left.     Thinking back, around May 2022 he had right >  left hand numbness lasting 2-3 weeks.  He also had allodynia in the trunk/stomach area.  He went to an Urgent Care and was told symptoms may have been due to stress (starting a new job).  He states even when younger his gait was a little different.     Imaging personally reviewed: MRI of the brain 07/05/2021 shows multiple T2/FLAIR hyperintense foci in the periventricular, juxtacortical and deep white matter of both hemispheres.  Foci in the left cerebellar hemorrhage and at least 4 foci in the cerebral hemispheres enhance after contrast consistent with acute demyelinating plaque associated with multiple sclerosis.  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  MRI of the cervical spine 07/05/2021 shows an enhancing  plaque in the anterior spinal cord adjacent to C6-C7.  Nonenhancing foci are noted centrally adjacent to C2-C3 and anteriorly and posteriorly adjacent to C3, to the right adjacent to C4, posterolaterally to the left adjacent to C4, to the right adjacent to C6 and to the left adjacent to C7.  No degenerative changes are noted.  MRI of the thoracic spine 07/05/2021 shows multiple T2 hyperintense foci throughout the spinal cord with enhancing plaques at T2-T3, T9 and T11.  MRI of the lumbar spine 07/06/2021 showed abnormal cord signal in the distal spinal cord and conus medullaris consistent with demyelination.  Cauda equina nerve roots were normal.  MRI brain and orbits 08/10/2021 showed acute left optic neuritis.   10-11 new enhancing lesions in brain not present on 06/2021  MRI cervical and thoracic spine showed new enhancing lesion at C5-C6 compared to 06/2021 and resolution of enhancement of other lesions seen in 06/2021  Laboratory test December 2022: Vitamin D was low at 10.0. Anti-NMO, anti-MOG,, HIV, Lyme, B12, ESR were normal or negative.   No close FH of MS.    REVIEW OF SYSTEMS: Constitutional: No fevers, chills, sweats, or change in appetite.  He has insomnia. Eyes: No visual changes, double vision, eye pain Ear, nose and throat: No hearing loss, ear pain, nasal congestion, sore throat Cardiovascular: No chest pain, palpitations Respiratory:  No shortness of breath at rest or with exertion.   No wheezes GastrointestinaI: No nausea, vomiting, diarrhea, abdominal pain, fecal incontinence Genitourinary:  No dysuria, urinary retention or frequency.  No nocturia. Musculoskeletal:  No neck pain, back pain Integumentary: No rash, pruritus, skin lesions Neurological: as above Psychiatric: No depression at this time.  No anxiety Endocrine: No palpitations, diaphoresis, change in appetite, change in weigh or increased thirst Hematologic/Lymphatic:  No anemia, purpura,  petechiae. Allergic/Immunologic: No itchy/runny eyes, nasal congestion, recent allergic reactions, rashes  ALLERGIES: No Known Allergies  HOME MEDICATIONS:  Current Outpatient Medications:    Cyanocobalamin (B-12 PO), Take 1 tablet by mouth daily., Disp: , Rfl:    Multiple Vitamin (MULTIVITAMIN ADULT PO), Take 1 tablet by mouth daily., Disp: , Rfl:    Ozanimod HCl (ZEPOSIA) 0.92 MG CAPS, Take by mouth., Disp: , Rfl:    predniSONE (DELTASONE) 50 MG tablet, Take 25 tablets (1,250 mg total) by mouth daily., Disp: 100 tablet, Rfl: 0   rosuvastatin (CRESTOR) 40 MG tablet, Take 1 tablet (40 mg total) by mouth daily., Disp: 30 tablet, Rfl: 0   traZODone (DESYREL) 50 MG tablet, Take 1 tablet (50 mg total) by mouth at bedtime., Disp: 30 tablet, Rfl: 5   Vitamin D, Ergocalciferol, (DRISDOL) 1.25 MG (50000 UNIT) CAPS capsule, Take 1 capsule (50,000 Units total) by mouth every 7 (seven) days. (Patient taking  differently: Take 50,000 Units by mouth every 7 (seven) days. sundays), Disp: 7 capsule, Rfl: 0  PAST MEDICAL HISTORY: Past Medical History:  Diagnosis Date   Hyperlipidemia    MS (multiple sclerosis) ([place])    Vitamin D deficiency     PAST SURGICAL HISTORY: Past Surgical History:  Procedure Laterality Date   ACHILLES TENDON SURGERY Bilateral 2007   Dr Ander Gaster    FAMILY HISTORY: Family History  Problem Relation Age of Onset   Diabetes Mother    Diabetes Father     SOCIAL HISTORY:  Social History   Socioeconomic History   Marital status: Single    Spouse name: Not on file   Number of children: Not on file   Years of education: Not on file   Highest education level: Bachelor's degree (e.g., BA, AB, BS)  Occupational History   Not on file  Tobacco Use   Smoking status: Never   Smokeless tobacco: Never  Substance and Sexual Activity   Alcohol use: Yes    Alcohol/week: 0.0 standard drinks    Comment: occasional   Drug use: No   Sexual activity: Not on file  Other Topics  Concern   Not on file  Social History Narrative   Lives with parents   Right handed   Caffeine: no soda, occa coffee   Social Determinants of Health   Financial Resource Strain: Not on file  Food Insecurity: Not on file  Transportation Needs: Not on file  Physical Activity: Not on file  Stress: Not on file  Social Connections: Not on file  Intimate Partner Violence: Not on file     PHYSICAL EXAM  Vitals:   12/08/21 1454  BP: 129/87  Pulse: 96  Weight: 211 lb (95.7 kg)  Height: 5' 8"  (1.727 m)    No results found.   Body mass index is 32.08 kg/m.   General: The patient is well-developed and well-nourished and in no acute distress  HEENT:  Head is [place]/AT.  Sclera are anicteric.    Skin: Extremities are without rash or  edema.  Neurologic Exam  Mental status: The patient is alert and oriented x 3 at the time of the examination. The patient has apparent normal recent and remote memory, with an apparently normal attention span and concentration ability.   Speech is normal.  Cranial nerves: Extraocular movements are full.  He has had left afferent pupillary defect. There is good facial sensation to soft touch bilaterally.Facial strength is normal.  Trapezius and sternocleidomastoid strength is normal. No dysarthria is noted.  The tongue is midline, and the patient has symmetric elevation of the soft palate. No obvious hearing deficits are noted.  Motor:  Muscle bulk is normal.   Tone is normal. Strength is  5 / 5 in all 4 extremities.   Sensory: Sensory testing is intact to pinprick, soft touch and vibration sensation in arms .  Reduced temperature left trunk/legs compared to right  Symmetric vibraiton arms and legs.     Coordination: Cerebellar testing reveals good finger-nose-finger and heel-to-shin bilaterally.  Gait and station: Station is normal.   Gait is gait is mildly wide.  Tandem gait is wide.. Romberg is negative.   Reflexes: Deep tendon reflexes are  symmetric and normal bilaterally.       DIAGNOSTIC DATA (LABS, IMAGING, TESTING) - I reviewed patient records, labs, notes, testing and imaging myself where available.  Lab Results  Component Value Date   WBC 8.8 08/10/2021   HGB 14.1 08/10/2021  HCT 42.2 08/10/2021   MCV 80.5 08/10/2021   PLT 356 08/10/2021      Component Value Date/Time   NA 139 08/10/2021 0754   K 3.2 (L) 08/10/2021 0754   CL 103 08/10/2021 0754   CO2 25 08/10/2021 0754   GLUCOSE 80 08/10/2021 0754   BUN 14 08/10/2021 0754   CREATININE 0.89 08/10/2021 0754   CREATININE 0.97 03/20/2016 1115   CALCIUM 9.4 08/10/2021 0754   PROT 7.8 07/05/2021 0318   ALBUMIN 3.7 07/05/2021 0318   AST 14 (L) 07/05/2021 0318   ALT 30 07/05/2021 0318   ALKPHOS 65 07/05/2021 0318   BILITOT 0.8 07/05/2021 0318   GFRNONAA >60 08/10/2021 0754   GFRNONAA >89 03/20/2016 1115   GFRAA >89 03/20/2016 1115   Lab Results  Component Value Date   CHOL 327 (H) 07/05/2021   HDL 35 (L) 07/05/2021   LDLCALC 255 (H) 07/05/2021   TRIG 183 (H) 07/05/2021   CHOLHDL 9.3 07/05/2021   Lab Results  Component Value Date   HGBA1C 5.7 (H) 07/04/2021   Lab Results  Component Value Date   VITAMINB12 392 07/06/2021   Lab Results  Component Value Date   TSH 2.47 03/20/2016       ASSESSMENT AND PLAN  Multiple sclerosis ([place]) - Plan: MR BRAIN W WO CONTRAST, CBC with Differential/Platelet  High risk medication use - Plan: CBC with Differential/Platelet  Gait disturbance    He will continue Zeposia.  I will check a follow-up MRI of the brain in about 2 months.  If there is breakthrough activity we will need to consider either Ocrevus, Briumvi or Tysabri.  Check CBC with differential today. Accommodations for work were filled out.  He is able to work full-time but due to his reduced balance, strength, endurance and fatigue, he would need a less physical job.  Details are above. Return in 4 months or sooner if there are new or  worsening neurologic symptoms.  45-minute office visit with the majority of the time spent face-to-face for history and physical, discussion/counseling and decision-making.  Additional time with record review and documentation.    Sholonda Jobst A. Felecia Shelling, MD, [facility] 9/75/8832, 5:49 PM Certified in Neurology, Clinical Neurophysiology, Sleep Medicine and Neuroimaging  [facility] [address], [place], [place] [address] [phone]

## 2021-12-08 NOTE — Progress Notes (Signed)
Faxed form to [employer] at [phone]. Received fax confirmation.

## 2021-12-08 NOTE — Telephone Encounter (Signed)
Called pt. Advised I spoke with Dr. Epimenio Foot who said he can fit him in at 3pm. He is agreeable to this and will check in at 2:30pm. He has ppw that needs to be filled out. He will bring this with him to appt. Requesting we fax it today for him.

## 2021-12-16 ENCOUNTER — Telehealth: Payer: Self-pay | Admitting: Neurology

## 2021-12-16 NOTE — Telephone Encounter (Signed)
[facility] pending case #[id number] for [employer]

## 2021-12-17 NOTE — Telephone Encounter (Signed)
LVM For patient to call back to schedule  [facility] NPR case [phone] schedule in July

## 2021-12-28 ENCOUNTER — Encounter: Payer: Self-pay | Admitting: Neurology

## 2021-12-28 DIAGNOSIS — G35 Multiple sclerosis: Secondary | ICD-10-CM

## 2021-12-29 MED ORDER — ZEPOSIA 0.92 MG PO CAPS: 1.0000 | ORAL_CAPSULE | Freq: Every day | ORAL | 0 refills | Status: DC

## 2021-12-29 NOTE — Telephone Encounter (Signed)
Zeposia sample placed up front for pt pick up. Included directions.  Also faxed completed/signed prescriber portion of [facility] [employer] re: Willie Archer at [phone]. Received fax confirmation.

## 2022-02-03 ENCOUNTER — Telehealth: Payer: Self-pay | Admitting: *Deleted

## 2022-02-03 NOTE — Telephone Encounter (Signed)
Gave completed/signed [employer] forms back to medical records to process for pt.

## 2022-03-09 ENCOUNTER — Telehealth: Payer: Self-pay | Admitting: Neurology

## 2022-03-09 NOTE — Telephone Encounter (Signed)
Nelva Bush @ [facility] is asking for a call back re: any updates on pt taking the [place] and his insurance information, please call [phone]

## 2022-03-09 NOTE — Telephone Encounter (Signed)
I called [employer]. I was placed on an extended hold waiting for an agent to answer. I was forced to leave a VM asking for a call back. If they call back, please obtain more information on the questions they have for [place].

## 2022-03-10 NOTE — Telephone Encounter (Signed)
**   If they call back please ask what questions they have for [place].   *According to our records there does not seem to by any new communication from the patient or mother. Last thing documented appears to be an application that was sent to the patient to complete. Last office visit in may states in plan he will continue zeposia.

## 2022-04-01 ENCOUNTER — Telehealth: Payer: Self-pay | Admitting: Neurology

## 2022-04-01 NOTE — Telephone Encounter (Signed)
Willie Archer is calling from [place]. Stated she needs for someone to get in touch with Pt and please tell him to call Zeposia. (Several attempts have been made to reach Pt but has not been successful.

## 2022-04-01 NOTE — Telephone Encounter (Signed)
Called the patient and there was no answer. LVM advising the patient to contact zeposia to get set up with the medication. I LVM providing the phone number to the patient where Marylene Land had called from [phone]

## 2022-04-22 ENCOUNTER — Encounter: Payer: Self-pay | Admitting: Neurology

## 2022-04-29 ENCOUNTER — Other Ambulatory Visit: Payer: 59

## 2022-06-23 ENCOUNTER — Telehealth: Payer: Self-pay | Admitting: Neurology

## 2022-06-23 NOTE — Telephone Encounter (Signed)
Sharlet Salina from [employer] called stating that they have made several attempts to reach pt with no success. Please call back to discuss.

## 2022-06-23 NOTE — Telephone Encounter (Addendum)
Called pt at [phone]. LVM for him to call office.   Called [phone].  LVM for him to cal office.  Called [employer]ep[employer] support back at [phone]. Spoke w/ Diannia Ruder. Advised we tried reaching with no luck as well.   If pt calls, please give him Zep[employer]ia 360 support phone# to call.

## 2022-07-28 ENCOUNTER — Encounter: Payer: Self-pay | Admitting: Neurology

## 2022-07-28 NOTE — Telephone Encounter (Signed)
Pt scheduled for follow up with Dr. Felecia Shelling for 08/11/22 at 1:30pm

## 2022-08-03 ENCOUNTER — Encounter: Payer: Self-pay | Admitting: Neurology

## 2022-08-03 ENCOUNTER — Ambulatory Visit (INDEPENDENT_AMBULATORY_CARE_PROVIDER_SITE_OTHER): Payer: Self-pay | Admitting: Neurology

## 2022-08-03 VITALS — BP 144/93 | HR 97 | Ht 68.0 in | Wt 218.0 lb

## 2022-08-03 DIAGNOSIS — G35D Multiple sclerosis, unspecified: Secondary | ICD-10-CM

## 2022-08-03 DIAGNOSIS — Z79899 Other long term (current) drug therapy: Secondary | ICD-10-CM

## 2022-08-03 DIAGNOSIS — E559 Vitamin D deficiency, unspecified: Secondary | ICD-10-CM

## 2022-08-03 DIAGNOSIS — G35 Multiple sclerosis: Secondary | ICD-10-CM

## 2022-08-03 DIAGNOSIS — H469 Unspecified optic neuritis: Secondary | ICD-10-CM

## 2022-08-03 DIAGNOSIS — G35A Relapsing-remitting multiple sclerosis: Secondary | ICD-10-CM

## 2022-08-03 MED ORDER — VITAMIN D (ERGOCALCIFEROL) 1.25 MG (50000 UNIT) PO CAPS: 50000.0000 [IU] | ORAL_CAPSULE | ORAL | 0 refills | Status: DC

## 2022-08-03 NOTE — Progress Notes (Signed)
[facility]  PATIENT: Willie Archer DOB: [birth year]  REFERRING DOCTOR OR PCP:  Lucky Cowboy, MD; Candelaria Stagers, MD SOURCE: Patient, extensive notes from recent hospitalization, imaging and laboratory reports, multiple MRI scans were reviewed personally  _________________________________   HISTORICAL  CHIEF COMPLAINT:  Chief Complaint  Patient presents with   Room 11    Pt is here Alone. Pt states that he deals with Fatigue. Pt states that he has muscle weakness throughout his body. Pt states no burning or tingling sensation in he legs and feet. Pt states that he has headaches. Pt states that he gets 2-3 headaches per week.     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   UPDATE1/15/2024: He started Zeposia in March and has not had any major exacerbation though he does continue to have fluctuating neurologic symptoms.    MS presented aggressively with 5 enhancing lesions on 06/2021 MRIs and multiple brainstem and spinal plaques.    I saw him early January 2023 and he had optic neuritis one week later (OS).  MRI at that time had shown additional enhancing lesions   He had preferred a pill to IV options  He feels gait is mildly off and he 'waddles'.   He saw PT for an evaluation and was felt to have left leg weakness.   He can walk a few miles but feels he is not at baseline ad would start to fatigue.   He can go downstairs without the bannister.   He has no numbness that is staying.   No visual problems - acuity or diplopia. Left eye improved.   Bladder function is fine.    He gets fatigued easily and usually does worse when that occurs.   He has more fatigue with physical activity.   He sleeps well once asleep and only occasionally has trouble falling asleep.       He notes mild anxiety but denies current depression.   He notes no definite cognitive change.   He has noted some issues with word finding.     He is able to work with  physical accommodations.  He can do desk work.   He is still off balanced and feels he cannot do some physical tasks.   Specifically, he cannot balance.    He cannot lift above 20 pounds more than rarely  (once an hour or less) or above 5-10 pounds frequently.       MS History: He is a 27 year old man who was diagnosed with MS December 2022 after presenting with leg weakness and difficulty walking.  He presented to the emergency room 07/04/2021 with 4 days of worsening numbness, weakness and gait issues.  He did have an upper respiratory infection about 2 weeks before the onset of symptoms.  He was admitted.  In initially, he was diagnosed with GBS and IVIG was started.  However, when noted to have hyperreflexia and abnormal signal in the lower spinal cord seen on lumbar MRI, a demyelinating disease was felt to be more likely and he had further MRI studies performed and was placed on IV steroids.  They were consistent with MS.   He received 5 days of IV Solu-medrol.   He notes improvement but not to baseline.    He is doing PT.    His right side is clumsier and weaker than his left.     Thinking back, around May 2022 he had right > left  hand numbness lasting 2-3 weeks.  He also had allodynia in the trunk/stomach area.  He went to an Urgent Care and was told symptoms may have been due to stress (starting a new job).  He states even when younger his gait was a little different.     Imaging personally reviewed: MRI of the brain 07/05/2021 shows multiple T2/FLAIR hyperintense foci in the periventricular, juxtacortical and deep white matter of both hemispheres.  Foci in the left cerebellar hemorrhage and at least 4 foci in the cerebral hemispheres enhance after contrast consistent with acute demyelinating plaque associated with multiple sclerosis.  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  MRI of the cervical spine 07/05/2021 shows an enhancing plaque in the  anterior spinal cord adjacent to C6-C7.  Nonenhancing foci are noted centrally adjacent to C2-C3 and anteriorly and posteriorly adjacent to C3, to the right adjacent to C4, posterolaterally to the left adjacent to C4, to the right adjacent to C6 and to the left adjacent to C7.  No degenerative changes are noted.  MRI of the thoracic spine 07/05/2021 shows multiple T2 hyperintense foci throughout the spinal cord with enhancing plaques at T2-T3, T9 and T11.  MRI of the lumbar spine 07/06/2021 showed abnormal cord signal in the distal spinal cord and conus medullaris consistent with demyelination.  Cauda equina nerve roots were normal.  MRI brain and orbits 08/10/2021 showed acute left optic neuritis.   10-11 new enhancing lesions in brain not present on 06/2021  MRI cervical and thoracic spine showed new enhancing lesion at C5-C6 compared to 06/2021 and resolution of enhancement of other lesions seen in 06/2021  Laboratory test December 2022: Vitamin D was low at 10.0. Anti-NMO, anti-MOG,, HIV, Lyme, B12, ESR were normal or negative.   No close FH of MS.    REVIEW OF SYSTEMS: Constitutional: No fevers, chills, sweats, or change in appetite.  He has insomnia. Eyes: No visual changes, double vision, eye pain Ear, nose and throat: No hearing loss, ear pain, nasal congestion, sore throat Cardiovascular: No chest pain, palpitations Respiratory:  No shortness of breath at rest or with exertion.   No wheezes GastrointestinaI: No nausea, vomiting, diarrhea, abdominal pain, fecal incontinence Genitourinary:  No dysuria, urinary retention or frequency.  No nocturia. Musculoskeletal:  No neck pain, back pain Integumentary: No rash, pruritus, skin lesions Neurological: as above Psychiatric: No depression at this time.  No anxiety Endocrine: No palpitations, diaphoresis, change in appetite, change in weigh or increased thirst Hematologic/Lymphatic:  No anemia, purpura, petechiae. Allergic/Immunologic:  No itchy/runny eyes, nasal congestion, recent allergic reactions, rashes  ALLERGIES: No Known Allergies  HOME MEDICATIONS:  Current Outpatient Medications:    Cyanocobalamin (B-12 PO), Take 1 tablet by mouth daily., Disp: , Rfl:    Multiple Vitamin (MULTIVITAMIN ADULT PO), Take 1 tablet by mouth daily., Disp: , Rfl:    Ozanimod HCl (ZEPOSIA) 0.92 MG CAPS, Take by mouth., Disp: , Rfl:    rosuvastatin (CRESTOR) 40 MG tablet, Take 1 tablet (40 mg total) by mouth daily., Disp: 30 tablet, Rfl: 0   traZODone (DESYREL) 50 MG tablet, Take 1 tablet (50 mg total) by mouth at bedtime., Disp: 30 tablet, Rfl: 5   ZEPOSIA 0.92 MG CAPS, Take 1 capsule (0.92 mg total) by mouth daily., Disp: 37 capsule, Rfl: 0   Vitamin D, Ergocalciferol, (DRISDOL) 1.25 MG (50000 UNIT) CAPS capsule, Take 1 capsule (50,000 Units total) by mouth every 7 (seven) days., Disp: 7 capsule, Rfl:  0  PAST MEDICAL HISTORY: Past Medical History:  Diagnosis Date   Hyperlipidemia    MS (multiple sclerosis) (HCC)    Vitamin D deficiency     PAST SURGICAL HISTORY: Past Surgical History:  Procedure Laterality Date   ACHILLES TENDON SURGERY Bilateral 2007   Dr Cheryll Cockayne    FAMILY HISTORY: Family History  Problem Relation Age of Onset   Diabetes Mother    Diabetes Father     SOCIAL HISTORY:  Social History   Socioeconomic History   Marital status: Single    Spouse name: Not on file   Number of children: Not on file   Years of education: Not on file   Highest education level: Bachelor's degree (e.g., BA, AB, BS)  Occupational History   Not on file  Tobacco Use   Smoking status: Never   Smokeless tobacco: Never  Substance and Sexual Activity   Alcohol use: Yes    Alcohol/week: 0.0 standard drinks of alcohol    Comment: occasional   Drug use: No   Sexual activity: Not on file  Other Topics Concern   Not on file  Social History Narrative   Lives with parents   Right handed   Caffeine: no soda, occa coffee    Social Determinants of Health   Financial Resource Strain: Not on file  Food Insecurity: Not on file  Transportation Needs: Not on file  Physical Activity: Not on file  Stress: Not on file  Social Connections: Not on file  Intimate Partner Violence: Not on file     PHYSICAL EXAM  Vitals:   08/03/22 0855  BP: (!) 144/93  Pulse: 97  Weight: 218 lb (98.9 kg)  Height: 5\' 8"  (1.727 m)    No results found.   Body mass index is 33.15 kg/m.   General: The patient is well-developed and well-nourished and in no acute distress  HEENT:  Head is [place]/AT.  Sclera are anicteric.    Skin: Extremities are without rash or  edema.  Neurologic Exam  Mental status: The patient is alert and oriented x 3 at the time of the examination. The patient has apparent normal recent and remote memory, with an apparently normal attention span and concentration ability.   Speech is normal.  Cranial nerves: Extraocular movements are full.  Visual acuity was symmetric.  Facial strength and sensation was normal.. No obvious hearing deficits are noted.  Motor:  Muscle bulk is normal.   Tone is normal. Strength is  5 / 5 in all 4 extremities.   Sensory: Sensory testing is intact to pinprick, soft touch and vibration sensation in arms and legs. Coordination: Cerebellar testing reveals good finger-nose-finger and heel-to-shin bilaterally.  Gait and station: Station is normal.   The gait was normal.  Tandem gait was just slightly wide.  Romberg is negative.  Reflexes: Deep tendon reflexes are symmetric and normal bilaterally.       DIAGNOSTIC DATA (LABS, IMAGING, TESTING) - I reviewed patient records, labs, notes, testing and imaging myself where available.  Lab Results  Component Value Date   WBC 8.8 08/10/2021   HGB 14.1 08/10/2021   HCT 42.2 08/10/2021   MCV 80.5 08/10/2021   PLT 356 08/10/2021      Component Value Date/Time   NA 139 08/10/2021 0754   K 3.2 (L) 08/10/2021 0754   CL 103  08/10/2021 0754   CO2 25 08/10/2021 0754   GLUCOSE 80 08/10/2021 0754   BUN 14 08/10/2021 0754   CREATININE  0.89 08/10/2021 0754   CREATININE 0.97 03/20/2016 1115   CALCIUM 9.4 08/10/2021 0754   PROT 7.8 07/05/2021 0318   ALBUMIN 3.7 07/05/2021 0318   AST 14 (L) 07/05/2021 0318   ALT 30 07/05/2021 0318   ALKPHOS 65 07/05/2021 0318   BILITOT 0.8 07/05/2021 0318   GFRNONAA >60 08/10/2021 0754   GFRNONAA >89 03/20/2016 1115   GFRAA >89 03/20/2016 1115   Lab Results  Component Value Date   CHOL 327 (H) 07/05/2021   HDL 35 (L) 07/05/2021   LDLCALC 255 (H) 07/05/2021   TRIG 183 (H) 07/05/2021   CHOLHDL 9.3 07/05/2021   Lab Results  Component Value Date   HGBA1C 5.7 (H) 07/04/2021   Lab Results  Component Value Date   VITAMINB12 392 07/06/2021   Lab Results  Component Value Date   TSH 2.47 03/20/2016       ASSESSMENT AND PLAN  Relapsing remitting multiple sclerosis ([place]) - Plan: MR BRAIN W WO CONTRAST, CBC with Differential/Platelet  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  Check a follow-up MRI of the brain in about 2 months.  If there is breakthrough activity we will need to consider either Ocrevus, Briumvi or Tysabri.  Check CBC with differential today.  He filled out the [employer] application for the MRI Filled out paperwork to medically clear him for physical testing at work  continue Vit D Return in 6 months or sooner if there are new or worsening neurologic symptoms.   Lawrie Tunks A. Felecia Shelling, MD, [facility] 7/37/1062, 6:94 AM Certified in Neurology, Clinical Neurophysiology, Sleep Medicine and Neuroimaging  [facility] [address], [place], [place] [address] [phone]

## 2022-08-03 NOTE — Progress Notes (Signed)
Faxed completed/signed form to [employer] attn: Dellis Filbert per MD/pt request at [phone]. Received fax confirmation.  Faxed completed/signed [employer] MRI access program application in at [phone]. Received fax confirmation.

## 2022-08-04 ENCOUNTER — Other Ambulatory Visit: Payer: Self-pay | Admitting: Neurology

## 2022-08-04 ENCOUNTER — Telehealth: Payer: Self-pay | Admitting: Neurology

## 2022-08-04 DIAGNOSIS — Z79899 Other long term (current) drug therapy: Secondary | ICD-10-CM

## 2022-08-04 DIAGNOSIS — G35 Multiple sclerosis: Secondary | ICD-10-CM

## 2022-08-04 LAB — CBC WITH DIFFERENTIAL/PLATELET
Basophils Absolute: 0 10*3/uL (ref 0.0–0.2)
Basos: 0 %
EOS (ABSOLUTE): 0.1 10*3/uL (ref 0.0–0.4)
Eos: 4 %
Hematocrit: 44.7 % (ref 37.5–51.0)
Hemoglobin: 14.9 g/dL (ref 13.0–17.7)
Immature Grans (Abs): 0 10*3/uL (ref 0.0–0.1)
Immature Granulocytes: 0 %
Lymphocytes Absolute: 0.4 10*3/uL — ABNORMAL LOW (ref 0.7–3.1)
Lymphs: 16 %
MCH: 26.8 pg (ref 26.6–33.0)
MCHC: 33.3 g/dL (ref 31.5–35.7)
MCV: 81 fL (ref 79–97)
Monocytes Absolute: 0.4 10*3/uL (ref 0.1–0.9)
Monocytes: 16 %
Neutrophils Absolute: 1.6 10*3/uL (ref 1.4–7.0)
Neutrophils: 64 %
Platelets: 306 10*3/uL (ref 150–450)
RBC: 5.55 x10E6/uL (ref 4.14–5.80)
RDW: 12.9 % (ref 11.6–15.4)
WBC: 2.5 10*3/uL — CL (ref 3.4–10.8)

## 2022-08-04 NOTE — Telephone Encounter (Signed)
self-pay sent to [facility] [phone]

## 2022-08-11 ENCOUNTER — Ambulatory Visit: Payer: Self-pay | Admitting: Neurology

## 2022-08-20 ENCOUNTER — Telehealth: Payer: Self-pay | Admitting: Neurology

## 2022-08-20 NOTE — Telephone Encounter (Signed)
LVM and sent mychart msg informing pt of need to reschedule 02/01/23 appointment - MD out 

## 2022-08-26 ENCOUNTER — Other Ambulatory Visit: Payer: Self-pay | Admitting: *Deleted

## 2022-08-26 DIAGNOSIS — E559 Vitamin D deficiency, unspecified: Secondary | ICD-10-CM

## 2022-08-26 MED ORDER — VITAMIN D (ERGOCALCIFEROL) 1.25 MG (50000 UNIT) PO CAPS: 50000.0000 [IU] | ORAL_CAPSULE | ORAL | 0 refills | Status: DC

## 2022-12-27 ENCOUNTER — Other Ambulatory Visit: Payer: Self-pay | Admitting: Neurology

## 2022-12-27 DIAGNOSIS — E559 Vitamin D deficiency, unspecified: Secondary | ICD-10-CM

## 2022-12-28 NOTE — Telephone Encounter (Addendum)
Last seen on 08/03/22 per note "continue Vit D" No 6 month follow up scheduled

## 2023-01-27 ENCOUNTER — Telehealth: Payer: Self-pay | Admitting: *Deleted

## 2023-01-27 DIAGNOSIS — G35 Multiple sclerosis: Secondary | ICD-10-CM

## 2023-01-27 NOTE — Telephone Encounter (Signed)
LMVM for pt that we received refill for zeposia from [facility] pharmacy.  Wanted to check on this for him but also appt reschedule. Had  opening 02-11-2023 1400,  if that would work.  Call us back.

## 2023-01-27 NOTE — Telephone Encounter (Signed)
Spoke with pt and asked him if he was still using his Zeposia and he stated that he was still taking it and was about to run out. Asked pt if he would like an Appointment on 02/11/2023 @ 2:00pm and he stated tha he would. Scheduled pt for that appointment. Sandy faxed his refill request.

## 2023-02-01 ENCOUNTER — Ambulatory Visit: Payer: Self-pay | Admitting: Neurology

## 2023-02-01 MED ORDER — ZEPOSIA 0.92 MG PO CAPS: 1.0000 | ORAL_CAPSULE | Freq: Every day | ORAL | Status: AC

## 2023-02-01 NOTE — Addendum Note (Signed)
Addended by: Guy Begin on: 02/01/2023 07:18 AM   Modules accepted: Orders

## 2023-02-01 NOTE — Telephone Encounter (Signed)
Fax confirmation received [facility] Zeposia 0.92mg  cap Take one capsule daily. #90 with 3 refills.

## 2023-02-11 ENCOUNTER — Encounter: Payer: Self-pay | Admitting: Neurology

## 2023-02-11 ENCOUNTER — Ambulatory Visit (INDEPENDENT_AMBULATORY_CARE_PROVIDER_SITE_OTHER): Payer: Self-pay | Admitting: Neurology

## 2023-02-11 VITALS — BP 125/82 | HR 86 | Ht 68.0 in | Wt 218.0 lb

## 2023-02-11 DIAGNOSIS — R269 Unspecified abnormalities of gait and mobility: Secondary | ICD-10-CM | POA: Insufficient documentation

## 2023-02-11 DIAGNOSIS — Z79899 Other long term (current) drug therapy: Secondary | ICD-10-CM

## 2023-02-11 DIAGNOSIS — G35 Multiple sclerosis: Secondary | ICD-10-CM

## 2023-02-11 DIAGNOSIS — R0683 Snoring: Secondary | ICD-10-CM

## 2023-02-11 DIAGNOSIS — G4719 Other hypersomnia: Secondary | ICD-10-CM

## 2023-02-11 DIAGNOSIS — H469 Unspecified optic neuritis: Secondary | ICD-10-CM

## 2023-02-11 MED ORDER — MODAFINIL 200 MG PO TABS: 200.0000 mg | ORAL_TABLET | Freq: Every day | ORAL | 5 refills | Status: DC

## 2023-02-11 NOTE — Progress Notes (Signed)
[facility]  PATIENT: Willie Archer DOB: 1996-03-01  REFERRING DOCTOR OR PCP:  Lucky Cowboy, MD; Candelaria Stagers, MD SOURCE: Patient, extensive notes from recent hospitalization, imaging and laboratory reports, multiple MRI scans were reviewed personally  _________________________________   HISTORICAL  CHIEF COMPLAINT:  Chief Complaint  Patient presents with   Rm 10    Patient is here alone for MS follow-up. Willie Archer states Willie Archer still has some issues with fatigue and stiffness, slight numbness and tingling but not as drastic as previously. Willie Archer hasn't had any falls. Willie Archer does feel fatigue is his biggest issue. Willie Archer is on Vitamin D but isn't sure its very helpful. Willie Archer is on Zeposia daily.    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   UPDATE 02/11/2023: Willie Archer started Zeposia in March //   Willie Archer denies exacerbation though Willie Archer does continue to have fluctuating neurologic symptoms.    Willie Archer notes a lot of fatigue  Gait is mildly ff balanced, worse right after standing up and Willie Archer feels stiff.      Willie Archer can walk a few miles but feels Willie Archer is not at baseline ad would start to fatigue.   Willie Archer can go downstairs without the bannister.   Willie Archer has no numbness that is staying.     No visual problems - acuity or diplopia. Left eye improved.     Bladder function is fine.    Willie Archer gets fatigued easily and usually does worse when that occurs.  This occurs daily.  Willie Archer takes a nap almost every day.   Willie Archer has more fatigue with physical activity.   Willie Archer sleeps up to 12 hours a day, though most nights is 9-10.   Willie Archer is not sure whether Willie Archer snores but might.     Willie Archer notes mild anxiety but denies current depression.  Not bad enough to reat Willie Archer notes no definite cognitive change.   Willie Archer has noted some issues with word finding.     Willie Archer is able to work with physical accommodations.  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   Willie Archer is still off balanced and feels Willie Archer cannot do some physical tasks.   Specifically, Willie Archer cannot  balance.    Willie Archer cannot lift above 20 pounds more than rarely  (once an hour or less) or above 5-10 pounds frequently.       EPWORTH SLEEPINESS SCALE  On a scale of 0 - 3 what is the chance of dozing:  Sitting and Reading:   2 Watching TV:    2 Sitting inactive in a public place: 3 Passenger in car for one hour: 0 Lying down to rest in the afternoon: 3 Sitting and talking to someone: 0 Sitting quietly after lunch:  2 In a car, stopped in traffic:  0  Total (out of 24):   12/24   (mild EDS)    MS History: Willie Archer is a 27 year old man who was diagnosed with MS December 2022 after presenting with leg weakness and difficulty walking.  Willie Archer presented to the emergency room 07/04/2021 with 4 days of worsening numbness, weakness and gait issues.  Willie Archer did have an upper respiratory infection about 2 weeks before the onset of symptoms.  Willie Archer was admitted.  In initially, Willie Archer was diagnosed with GBS and IVIG was started.  However, when noted to have hyperreflexia and abnormal signal in the lower spinal cord seen on lumbar MRI, a demyelinating disease was felt to be more likely and Willie Archer  had further MRI studies performed and was placed on IV steroids.  They were consistent with MS.   Willie Archer received 5 days of IV Solu-medrol.   Willie Archer notes improvement but not to baseline.    Willie Archer is doing PT.    His right side is clumsier and weaker than his left.     Thinking back, around May 2022 Willie Archer had right > left hand numbness lasting 2-3 weeks.  Willie Archer also had allodynia in the trunk/stomach area.  Willie Archer went to an Urgent Care and was told symptoms may have been due to stress (starting a new job).  Willie Archer states even when younger his gait was a little different.     Although Willie Archer presented aggressively with 5 enhancing lesions on 06/2021 MRIs and multiple brainstem and spinal plaques.    I saw him early January 2023 and Willie Archer had optic neuritis one week later (OS).  MRI at that time had shown additional enhancing lesions   Willie Archer had preferred a pill to IV  options  Imaging personally reviewed: MRI of the brain 07/05/2021 shows multiple T2/FLAIR hyperintense foci in the periventricular, juxtacortical and deep white matter of both hemispheres.  Foci in the left cerebellar hemorrhage and at least 4 foci in the cerebral hemispheres enhance after contrast consistent with acute demyelinating plaque associated with multiple sclerosis.  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  MRI of the cervical spine 07/05/2021 shows an enhancing plaque in the anterior spinal cord adjacent to C6-C7.  Nonenhancing foci are noted centrally adjacent to C2-C3 and anteriorly and posteriorly adjacent to C3, to the right adjacent to C4, posterolaterally to the left adjacent to C4, to the right adjacent to C6 and to the left adjacent to C7.  No degenerative changes are noted.  MRI of the thoracic spine 07/05/2021 shows multiple T2 hyperintense foci throughout the spinal cord with enhancing plaques at T2-T3, T9 and T11.  MRI of the lumbar spine 07/06/2021 showed abnormal cord signal in the distal spinal cord and conus medullaris consistent with demyelination.  Cauda equina nerve roots were normal.  MRI brain and orbits 08/10/2021 showed acute left optic neuritis.   10-11 new enhancing lesions in brain not present on 06/2021  MRI cervical and thoracic spine showed new enhancing lesion at C5-C6 compared to 06/2021 and resolution of enhancement of other lesions seen in 06/2021  Laboratory test December 2022: Vitamin D was low at 10.0. Anti-NMO, anti-MOG,, HIV, Lyme, B12, ESR were normal or negative.   No close FH of MS.    REVIEW OF SYSTEMS: Constitutional: No fevers, chills, sweats, or change in appetite.  Willie Archer has insomnia. Eyes: No visual changes, double vision, eye pain Ear, nose and throat: No hearing loss, ear pain, nasal congestion, sore throat Cardiovascular: No chest pain, palpitations Respiratory:  No shortness of  breath at rest or with exertion.   No wheezes GastrointestinaI: No nausea, vomiting, diarrhea, abdominal pain, fecal incontinence Genitourinary:  No dysuria, urinary retention or frequency.  No nocturia. Musculoskeletal:  No neck pain, back pain Integumentary: No rash, pruritus, skin lesions Neurological: as above Psychiatric: No depression at this time.  No anxiety Endocrine: No palpitations, diaphoresis, change in appetite, change in weigh or increased thirst Hematologic/Lymphatic:  No anemia, purpura, petechiae. Allergic/Immunologic: No itchy/runny eyes, nasal congestion, recent allergic reactions, rashes  ALLERGIES: No Known Allergies  HOME MEDICATIONS:  Current Outpatient Medications:    Cyanocobalamin (B-12 PO), Take 1 tablet by mouth daily., Disp: ,  Rfl:    modafinil (PROVIGIL) 200 MG tablet, Take 1 tablet (200 mg total) by mouth daily., Disp: 30 tablet, Rfl: 5   Multiple Vitamin (MULTIVITAMIN ADULT PO), Take 1 tablet by mouth daily., Disp: , Rfl:    Ozanimod HCl (ZEPOSIA) 0.92 MG CAPS, Take by mouth., Disp: , Rfl:    rosuvastatin (CRESTOR) 40 MG tablet, Take 1 tablet (40 mg total) by mouth daily., Disp: 30 tablet, Rfl: 0   Vitamin D, Ergocalciferol, (DRISDOL) 1.25 MG (50000 UNIT) CAPS capsule, TAKE 1 CAPSULE (50,000 UNITS TOTAL) BY MOUTH EVERY 7 (SEVEN) DAYS, Disp: 7 capsule, Rfl: 0   ZEPOSIA 0.92 MG CAPS, Take 1 capsule (0.92 mg total) by mouth daily., Disp: , Rfl:    traZODone (DESYREL) 50 MG tablet, Take 1 tablet (50 mg total) by mouth at bedtime. (Patient not taking: Reported on 02/11/2023), Disp: 30 tablet, Rfl: 5  PAST MEDICAL HISTORY: Past Medical History:  Diagnosis Date   Hyperlipidemia    MS (multiple sclerosis) (HCC)    Vitamin D deficiency     PAST SURGICAL HISTORY: Past Surgical History:  Procedure Laterality Date   ACHILLES TENDON SURGERY Bilateral 2007   Dr Cheryll Cockayne    FAMILY HISTORY: Family History  Problem Relation Age of Onset   Diabetes Mother     Diabetes Father     SOCIAL HISTORY:  Social History   Socioeconomic History   Marital status: Single    Spouse name: Not on file   Number of children: Not on file   Years of education: Not on file   Highest education level: Bachelor's degree (e.g., BA, AB, BS)  Occupational History   Not on file  Tobacco Use   Smoking status: Never   Smokeless tobacco: Never  Substance and Sexual Activity   Alcohol use: Not Currently    Comment: occasional   Drug use: No   Sexual activity: Not on file  Other Topics Concern   Not on file  Social History Narrative   Lives with parents   Right handed   Caffeine: soda about 3 days per week, occa coffee   Social Determinants of Health   Financial Resource Strain: Not on file  Food Insecurity: Not on file  Transportation Needs: Not on file  Physical Activity: Not on file  Stress: Not on file  Social Connections: Not on file  Intimate Partner Violence: Not on file     PHYSICAL EXAM  Vitals:   02/11/23 1404  BP: 125/82  Pulse: 86  Weight: 218 lb (98.9 kg)  Height: 5\' 8"  (1.727 m)    No results found.   Body mass index is 33.15 kg/m.   General: The patient is well-developed and well-nourished and in no acute distress  HEENT:  Head is [place]/AT.  Sclera are anicteric.    Skin: Extremities are without rash or  edema.  Neurologic Exam  Mental status: The patient is alert and oriented x 3 at the time of the examination. The patient has apparent normal recent and remote memory, with an apparently normal attention span and concentration ability.   Speech is normal.  Cranial nerves: Extraocular movements are full.  Visual acuity was symmetric.  Facial strength and sensation was normal.. No obvious hearing deficits are noted.  Motor:  Muscle bulk is normal.   Tone is normal. Strength is  5 / 5 in all 4 extremities.   Sensory: Sensory testing is intact to pinprick, soft touch and vibration sensation in arms and legs. Coordination:  Cerebellar testing reveals good finger-nose-finger and heel-to-shin bilaterally.  Gait and station: Station is normal.   The gait was normal.  Tandem gait is slightly wide..  Romberg is negative.  Reflexes: Deep tendon reflexes are symmetric and normal bilaterally.       DIAGNOSTIC DATA (LABS, IMAGING, TESTING) - I reviewed patient records, labs, notes, testing and imaging myself where available.  Lab Results  Component Value Date   WBC 2.5 (LL) 08/03/2022   HGB 14.9 08/03/2022   HCT 44.7 08/03/2022   MCV 81 08/03/2022   PLT 306 08/03/2022      Component Value Date/Time   NA 139 08/10/2021 0754   K 3.2 (L) 08/10/2021 0754   CL 103 08/10/2021 0754   CO2 25 08/10/2021 0754   GLUCOSE 80 08/10/2021 0754   BUN 14 08/10/2021 0754   CREATININE 0.89 08/10/2021 0754   CREATININE 0.97 03/20/2016 1115   CALCIUM 9.4 08/10/2021 0754   PROT 7.8 07/05/2021 0318   ALBUMIN 3.7 07/05/2021 0318   AST 14 (L) 07/05/2021 0318   ALT 30 07/05/2021 0318   ALKPHOS 65 07/05/2021 0318   BILITOT 0.8 07/05/2021 0318   GFRNONAA >60 08/10/2021 0754   GFRNONAA >89 03/20/2016 1115   GFRAA >89 03/20/2016 1115   Lab Results  Component Value Date   CHOL 327 (H) 07/05/2021   HDL 35 (L) 07/05/2021   LDLCALC 255 (H) 07/05/2021   TRIG 183 (H) 07/05/2021   CHOLHDL 9.3 07/05/2021   Lab Results  Component Value Date   HGBA1C 5.7 (H) 07/04/2021   Lab Results  Component Value Date   VITAMINB12 392 07/06/2021   Lab Results  Component Value Date   TSH 2.47 03/20/2016       ASSESSMENT AND PLAN  Multiple sclerosis (HCC) - Plan: CBC with Differential/Platelet  High risk medication use - Plan: CBC with Differential/Platelet  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  Check labs.   Willie Archer does not have insurance.  We can see if we can have him get an MRI through  the [employer].  It looks like we faxed the paperwork back in January but not clear if Willie Archer ever followed up Trial of modafinil for excessive daytime sleepiness. continue Vit D Home sleep test to determine if sleep apnea causing his excessive daytime sleepiness.  We discussed that MS is more likely to cause fatigue and sleepiness. Return in 6 months or sooner if there are new or worsening neurologic symptoms.   Helyn Schwan A. Epimenio Foot, MD, [facility] 02/11/2023, 3:20 PM Certified in Neurology, Clinical Neurophysiology, Sleep Medicine and Neuroimaging  [facility] [address] [place], [place] [address] [phone]

## 2023-04-15 ENCOUNTER — Encounter: Payer: Self-pay | Admitting: Neurology

## 2023-04-19 NOTE — Telephone Encounter (Signed)
Faxed completed/signed [employer] MRI access program application to [phone]. Received fax confirmation.

## 2023-04-20 NOTE — Telephone Encounter (Signed)
Faxed back completed/signed [employer] patient assistance foundation form for Hassie Bruce to [phone]. Received fax confirmation.

## 2023-04-28 NOTE — Telephone Encounter (Signed)
Faxed back updated [employer] PAF to include outpt status and household size. Fax: [phone]. Received fax confirmation.

## 2023-08-11 ENCOUNTER — Encounter: Payer: Self-pay | Admitting: Family Medicine

## 2023-08-11 ENCOUNTER — Ambulatory Visit (INDEPENDENT_AMBULATORY_CARE_PROVIDER_SITE_OTHER): Payer: Self-pay | Admitting: Family Medicine

## 2023-08-11 VITALS — BP 150/104 | HR 110 | Ht 69.0 in | Wt 204.5 lb

## 2023-08-11 DIAGNOSIS — E559 Vitamin D deficiency, unspecified: Secondary | ICD-10-CM

## 2023-08-11 DIAGNOSIS — Z79899 Other long term (current) drug therapy: Secondary | ICD-10-CM

## 2023-08-11 DIAGNOSIS — G35 Multiple sclerosis: Secondary | ICD-10-CM

## 2023-08-11 DIAGNOSIS — G4719 Other hypersomnia: Secondary | ICD-10-CM

## 2023-08-11 NOTE — Progress Notes (Signed)
Chief Complaint  Patient presents with   Room 2    Pt is here Alone. Pt states that his fatigue is getting worse. Pt states that he has headaches in the back of his head.     HISTORY OF PRESENT ILLNESS:  08/11/23 ALL:  Willie Archer is a 28 y.o. male here today for follow up for RRMS. He continues Zeposia. Last MRI brain and cervical spine showed new lesions 07/2021, thoracic imaging stable.   He reports symptoms are fairly stable. He continues to have intermittent imbalance. He reports a couple of falls getting into the shower. No other falls. No assistive device. He continues to have significant fatigue. He was started on modafinil 200mg  daily but reports never filling the prescription as he was told it would cost 1000. He reports intermittent exercise. Usually 1-2 times a week at most. He usually lifts weights.   He does not sleep well. He does not have a dedicated bed time. He usually wakes around 6-6:30. He has not taken trazodone consistently. HST ordered 01/2023 but he has not been able to afford. Currently uninsured. He is not taking vitamin D.   He does have regular headaches. Usually pounding pain in the back of his head. Pain may last 30-180 minutes. Not usually all day and usually resolves spontaneously. BP elevated, today. Unclear what baseline reading is. He tries to eat mostly lean meats and green leafy vegetables.   No bowel/bladder changes. He denies vision changes but does not difficulty seeing at night. He has not had a recent eye exam.    HISTORY (copied from Dr Bonnita Hollow previous note)  He started [place] in March //   He denies exacerbation though he does continue to have fluctuating neurologic symptoms.    He notes a lot of fatigue   Gait is mildly ff balanced, worse right after standing up and he feels stiff.      He can walk a few miles but feels he is not at baseline ad would start to fatigue.   He can go downstairs without the bannister.   He has no numbness  that is staying.      No visual problems - acuity or diplopia. Left eye improved.      Bladder function is fine.     He gets fatigued easily and usually does worse when that occurs.  This occurs daily.  He takes a nap almost every day.   He has more fatigue with physical activity.   He sleeps up to 12 hours a day, though most nights is 9-10.   He is not sure whether he snores but might.      He notes mild anxiety but denies current depression.  Not bad enough to reat He notes no definite cognitive change.   He has noted some issues with word finding.      He is able to work with physical accommodations.  He can do desk work.   He is still off balanced and feels he cannot do some physical tasks.   Specifically, he cannot balance.    He cannot lift above 20 pounds more than rarely  (once an hour or less) or above 5-10 pounds frequently.         EPWORTH SLEEPINESS SCALE   On a scale of 0 - 3 what is the chance of dozing:   Sitting and Reading:  2 Watching TV:                                      2 Sitting inactive in a public place:        3 Passenger in car for one hour:           0 Lying down to rest in the afternoon:   3 Sitting and talking to someone:          0 Sitting quietly after lunch:                   2 In a car, stopped in traffic:                  0   Total (out of 24):   12/24   (mild EDS)     MS History: He is a 28 year old man who was diagnosed with MS December 2022 after presenting with leg weakness and difficulty walking.  He presented to the emergency room 07/04/2021 with 4 days of worsening numbness, weakness and gait issues.  He did have an upper respiratory infection about 2 weeks before the onset of symptoms.  He was admitted.  In initially, he was diagnosed with GBS and IVIG was started.  However, when noted to have hyperreflexia and abnormal signal in the lower spinal cord seen on lumbar MRI, a demyelinating disease was felt to be more  likely and he had further MRI studies performed and was placed on IV steroids.  They were consistent with MS.   He received 5 days of IV Solu-medrol.   He notes improvement but not to baseline.    He is doing PT.    His right side is clumsier and weaker than his left.      Thinking back, around May 2022 he had right > left hand numbness lasting 2-3 weeks.  He also had allodynia in the trunk/stomach area.  He went to an Urgent Care and was told symptoms may have been due to stress (starting a new job).  He states even when younger his gait was a little different.      Although he presented aggressively with 5 enhancing lesions on 06/2021 MRIs and multiple brainstem and spinal plaques.    I saw him early January 2023 and he had optic neuritis one week later (OS).  MRI at that time had shown additional enhancing lesions   He had preferred a pill to IV options   Imaging personally reviewed: MRI of the brain 07/05/2021 shows multiple T2/FLAIR hyperintense foci in the periventricular, juxtacortical and deep white matter of both hemispheres.  Foci in the left cerebellar hemorrhage and at least 4 foci in the cerebral hemispheres enhance after contrast consistent with acute demyelinating plaque associated with multiple sclerosis.  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   MRI of the cervical spine 07/05/2021 shows an enhancing plaque in the anterior spinal cord adjacent to C6-C7.  Nonenhancing foci are noted centrally adjacent to C2-C3 and anteriorly and posteriorly adjacent to C3, to the right adjacent to C4, posterolaterally to the left adjacent to C4, to the right adjacent to C6 and to the left adjacent to C7.  No degenerative changes are noted.   MRI of the thoracic spine 07/05/2021 shows multiple T2 hyperintense foci throughout the spinal cord with enhancing plaques at T2-T3, T9  and T11.   MRI of the lumbar spine 07/06/2021 showed abnormal cord signal in  the distal spinal cord and conus medullaris consistent with demyelination.  Cauda equina nerve roots were normal.   MRI brain and orbits 08/10/2021 showed acute left optic neuritis.   10-11 new enhancing lesions in brain not present on 06/2021   MRI cervical and thoracic spine showed new enhancing lesion at C5-C6 compared to 06/2021 and resolution of enhancement of other lesions seen in 06/2021   Laboratory test December 2022: Vitamin D was low at 10.0. Anti-NMO, anti-MOG,, HIV, Lyme, B12, ESR were normal or negative.   REVIEW OF SYSTEMS: Out of a complete 14 system review of symptoms, the patient complains only of the following symptoms, fatigue, headaches, insomnia, imbalance, and all other reviewed systems are negative.   ALLERGIES: No Known Allergies   HOME MEDICATIONS: Outpatient Medications Prior to Visit  Medication Sig Dispense Refill   Cyanocobalamin (B-12 PO) Take 1 tablet by mouth daily.     Multiple Vitamin (MULTIVITAMIN ADULT PO) Take 1 tablet by mouth daily.     Ozanimod HCl (ZEPOSIA) 0.92 MG CAPS Take by mouth.     rosuvastatin (CRESTOR) 40 MG tablet Take 1 tablet (40 mg total) by mouth daily. 30 tablet 0   ZEPOSIA 0.92 MG CAPS Take 1 capsule (0.92 mg total) by mouth daily.     modafinil (PROVIGIL) 200 MG tablet Take 1 tablet (200 mg total) by mouth daily. (Patient not taking: Reported on 08/11/2023) 30 tablet 5   traZODone (DESYREL) 50 MG tablet Take 1 tablet (50 mg total) by mouth at bedtime. (Patient not taking: Reported on 02/11/2023) 30 tablet 5   Vitamin D, Ergocalciferol, (DRISDOL) 1.25 MG (50000 UNIT) CAPS capsule TAKE 1 CAPSULE (50,000 UNITS TOTAL) BY MOUTH EVERY 7 (SEVEN) DAYS (Patient not taking: Reported on 08/11/2023) 7 capsule 0   No facility-administered medications prior to visit.     PAST MEDICAL HISTORY: Past Medical History:  Diagnosis Date   Hyperlipidemia    MS (multiple sclerosis) (HCC)    Vitamin D deficiency      PAST SURGICAL  HISTORY: Past Surgical History:  Procedure Laterality Date   ACHILLES TENDON SURGERY Bilateral 2007   Dr Cheryll Cockayne     FAMILY HISTORY: Family History  Problem Relation Age of Onset   Diabetes Mother    Diabetes Father      SOCIAL HISTORY: Social History   Socioeconomic History   Marital status: Single    Spouse name: Not on file   Number of children: Not on file   Years of education: Not on file   Highest education level: Bachelor's degree (e.g., BA, AB, BS)  Occupational History   Not on file  Tobacco Use   Smoking status: Never   Smokeless tobacco: Never  Substance and Sexual Activity   Alcohol use: Not Currently    Comment: occasional   Drug use: No   Sexual activity: Not on file  Other Topics Concern   Not on file  Social History Narrative   Lives with parents   Right handed   Caffeine: soda about 3 days per week, occa coffee   Social Drivers of Corporate investment banker Strain: Not on file  Food Insecurity: Not on file  Transportation Needs: Not on file  Physical Activity: Not on file  Stress: Not on file  Social Connections: Not on file  Intimate Partner Violence: Not on file     PHYSICAL EXAM  Vitals:  08/11/23 1410  BP: (!) 150/104  Pulse: (!) 110  Weight: 204 lb 8 oz (92.8 kg)  Height: 5\' 9"  (1.753 m)   Body mass index is 30.2 kg/m.  Generalized: Well developed, in no acute distress  Cardiology: normal rate and rhythm, no murmur auscultated  Respiratory: clear to auscultation bilaterally    Neurological examination  Mentation: Alert oriented to time, place, history taking. Follows all commands speech and language fluent Cranial nerve II-XII: Pupils were equal round reactive to light. Extraocular movements were full, visual field were full on confrontational test. Facial sensation and strength were normal. Uvula tongue midline. Head turning and shoulder shrug  were normal and symmetric. Motor: The motor testing reveals 5 over 5  strength of all 4 extremities. Good symmetric motor tone is noted throughout.  Sensory: Sensory testing is intact to soft touch on all 4 extremities. No evidence of extinction is noted.  Coordination: Cerebellar testing reveals good finger-nose-finger and heel-to-shin bilaterally.  Gait and station: Gait is normal. Reflexes: Deep tendon reflexes are symmetric and normal bilaterally.    DIAGNOSTIC DATA (LABS, IMAGING, TESTING) - I reviewed patient records, labs, notes, testing and imaging myself where available.  Lab Results  Component Value Date   WBC 2.5 (LL) 02/11/2023   HGB 14.2 02/11/2023   HCT 42.1 02/11/2023   MCV 80 02/11/2023   PLT 295 02/11/2023      Component Value Date/Time   NA 139 08/10/2021 0754   K 3.2 (L) 08/10/2021 0754   CL 103 08/10/2021 0754   CO2 25 08/10/2021 0754   GLUCOSE 80 08/10/2021 0754   BUN 14 08/10/2021 0754   CREATININE 0.89 08/10/2021 0754   CREATININE 0.97 03/20/2016 1115   CALCIUM 9.4 08/10/2021 0754   PROT 7.8 07/05/2021 0318   ALBUMIN 3.7 07/05/2021 0318   AST 14 (L) 07/05/2021 0318   ALT 30 07/05/2021 0318   ALKPHOS 65 07/05/2021 0318   BILITOT 0.8 07/05/2021 0318   GFRNONAA >60 08/10/2021 0754   GFRNONAA >89 03/20/2016 1115   GFRAA >89 03/20/2016 1115   Lab Results  Component Value Date   CHOL 327 (H) 07/05/2021   HDL 35 (L) 07/05/2021   LDLCALC 255 (H) 07/05/2021   TRIG 183 (H) 07/05/2021   CHOLHDL 9.3 07/05/2021   Lab Results  Component Value Date   HGBA1C 5.7 (H) 07/04/2021   Lab Results  Component Value Date   VITAMINB12 392 07/06/2021   Lab Results  Component Value Date   TSH 2.47 03/20/2016        No data to display               No data to display           ASSESSMENT AND PLAN  28 y.o. year old male  has a past medical history of Hyperlipidemia, MS (multiple sclerosis) (HCC), and Vitamin D deficiency. here with    Vitamin D deficiency - Plan: Vitamin D, 25-hydroxy  Relapsing remitting  multiple sclerosis (HCC) - Plan: CBC with Differential/Platelets  High risk medication use  Excessive daytime sleepiness  Jazmine L Trow reports fatigue continues to be most debilitating symptom. We have reviewed Good Rx prices associated with multiple pharmacies in the area. He prefers to create online account with [facility] pharmacy and will let me know when this is completed. I am happy to send modafinil 200mg  daily. Possible side effects reviewed. I have encouraged him to work on sleep hygiene and increase physical activity. Continue trazodone  50mg  at bedtime. Consistent use could help with headaches as well. He was encouraged to monitor BP at home. Healthy lifestyle habits encouraged. He will return to see Dr Epimenio Foot in 6 months, sooner if needed. He verbalizes understanding and agreement with this plan.    Orders Placed This Encounter  Procedures   CBC with Differential/Platelets   Vitamin D, 25-hydroxy     No orders of the defined types were placed in this encounter.    Shawnie Dapper, MSN, FNP-C 08/11/2023, 3:37 PM  [facility] [address] [place], [place] [address] [phone]

## 2023-08-11 NOTE — Patient Instructions (Addendum)
Below is our plan:  We will continue Zeposia. I will update labs, today. Please look around at local pharmacies or through [facility] pharmacy to get trazodone (for sleep) and modafinil (to help with fatigue). Please keep a close eye on your blood pressure at home. I would like for it to stay less than 130/90.   Please make sure you are staying well hydrated. I recommend 50-60 ounces daily. Well balanced diet and regular exercise encouraged. Consistent sleep schedule with 6-8 hours recommended.   Please continue follow up with care team as directed.   Follow up with Dr Epimenio Foot in 6 months   You may receive a survey regarding today's visit. I encourage you to leave honest feed back as I do use this information to improve patient care. Thank you for seeing me today!

## 2023-08-12 ENCOUNTER — Other Ambulatory Visit (HOSPITAL_COMMUNITY): Payer: Self-pay

## 2023-08-12 ENCOUNTER — Encounter: Payer: Self-pay | Admitting: Family Medicine

## 2023-08-12 LAB — CBC WITH DIFFERENTIAL/PLATELET
Basophils Absolute: 0 10*3/uL (ref 0.0–0.2)
Basos: 1 %
EOS (ABSOLUTE): 0.2 10*3/uL (ref 0.0–0.4)
Eos: 8 %
Hematocrit: 43.3 % (ref 37.5–51.0)
Hemoglobin: 14.9 g/dL (ref 13.0–17.7)
Immature Grans (Abs): 0 10*3/uL (ref 0.0–0.1)
Immature Granulocytes: 1 %
Lymphocytes Absolute: 0.3 10*3/uL — ABNORMAL LOW (ref 0.7–3.1)
Lymphs: 16 %
MCH: 28.1 pg (ref 26.6–33.0)
MCHC: 34.4 g/dL (ref 31.5–35.7)
MCV: 82 fL (ref 79–97)
Monocytes Absolute: 0.3 10*3/uL (ref 0.1–0.9)
Monocytes: 15 %
Neutrophils Absolute: 1.3 10*3/uL — ABNORMAL LOW (ref 1.4–7.0)
Neutrophils: 59 %
Platelets: 308 10*3/uL (ref 150–450)
RBC: 5.3 x10E6/uL (ref 4.14–5.80)
RDW: 13.1 % (ref 11.6–15.4)
WBC: 2.1 10*3/uL — CL (ref 3.4–10.8)

## 2023-08-12 LAB — VITAMIN D 25 HYDROXY (VIT D DEFICIENCY, FRACTURES): Vit D, 25-Hydroxy: 14 ng/mL — ABNORMAL LOW (ref 30.0–100.0)

## 2023-08-12 MED ORDER — MODAFINIL 200 MG PO TABS
200.0000 mg | ORAL_TABLET | Freq: Every day | ORAL | 5 refills | Status: AC
Start: 2023-08-12 — End: ?
  Filled 2023-08-12: qty 30, 30d supply, fill #0

## 2023-08-12 MED ORDER — TRAZODONE HCL 50 MG PO TABS: 50.0000 mg | ORAL_TABLET | Freq: Every day | ORAL | 5 refills | Status: AC

## 2023-09-12 ENCOUNTER — Encounter: Payer: Self-pay | Admitting: Neurology

## 2023-09-27 ENCOUNTER — Encounter: Payer: Self-pay | Admitting: Neurology

## 2023-09-28 ENCOUNTER — Telehealth: Payer: Self-pay

## 2023-09-28 NOTE — Telephone Encounter (Signed)
 Faxed in pt assistance form for zeposia to [facility]

## 2024-04-29 ENCOUNTER — Emergency Department (HOSPITAL_BASED_OUTPATIENT_CLINIC_OR_DEPARTMENT_OTHER)
Admission: EM | Admit: 2024-04-29 | Discharge: 2024-04-30 | Disposition: A | Discharge status: Home / Self Care | Payer: Self-pay | Source: Ambulatory Visit | Attending: Emergency Medicine | Admitting: Emergency Medicine

## 2024-04-29 ENCOUNTER — Other Ambulatory Visit: Payer: Self-pay

## 2024-04-29 DIAGNOSIS — R1084 Generalized abdominal pain: Secondary | ICD-10-CM | POA: Insufficient documentation

## 2024-04-29 NOTE — ED Triage Notes (Signed)
 Pt states that approx 2 hours ago he had generalized abdominal cramping and 2 BM's. He describes first stool as hard and 2nd was loose. Pt is pain free at this time but EMS was called and suggested he come to ED for rule out of diverticulitis.

## 2024-04-30 NOTE — Discharge Instructions (Signed)

## 2024-04-30 NOTE — ED Provider Notes (Signed)
 [facility] EMERGENCY DEPARTMENT AT [facility] Provider Note   CSN: [id number] Arrival date & time: 04/29/24  2243     Patient presents with: Abdominal Pain   Willie Archer is a 28 y.o. male.   The history is provided by the patient and a parent.  Patient with history of multiple sclerosis presents for abdominal cramping that is improving.  Patient reports over the past 24 hours he has ate very little food which is typical for him.  He reports during the day he started having abdominal cramping and had 2 hard bowel movements that were nonbloody.  Cramping continued and he had 2 loose stools that were nonbloody.  No fevers or vomiting.  He was evaluated by EMS who suggest that he may have diverticulitis.  No previous history of diverticulitis. No previous abdominal surgery Patient is now feeling improved and requesting discharge    Prior to Admission medications   Medication Sig Start Date End Date Taking? Authorizing Provider  Cyanocobalamin (B-12 PO) Take 1 tablet by mouth daily.    [provider]  modafinil  (PROVIGIL ) 200 MG tablet Take 1 tablet (200 mg total) by mouth daily. 08/12/23   Lomax, Amy, NP  Multiple Vitamin (MULTIVITAMIN ADULT PO) Take 1 tablet by mouth daily.    [provider]  Ozanimod HCl (ZEPOSIA ) 0.92 MG CAPS Take by mouth.    [provider]  rosuvastatin  (CRESTOR ) 40 MG tablet Take 1 tablet (40 mg total) by mouth daily. 08/10/21   Prosperi, Christian H, PA-C  traZODone  (DESYREL ) 50 MG tablet Take 1 tablet (50 mg total) by mouth at bedtime. 08/12/23   Lomax, Amy, NP  Vitamin D , Ergocalciferol , (DRISDOL ) 1.25 MG (50000 UNIT) CAPS capsule TAKE 1 CAPSULE (50,000 UNITS TOTAL) BY MOUTH EVERY 7 (SEVEN) DAYS Patient not taking: Reported on 08/11/2023 12/28/22   Sater, Charlie LABOR, MD  ZEPOSIA  0.92 MG CAPS Take 1 capsule (0.92 mg total) by mouth daily. 02/01/23   Sater, Charlie LABOR, MD    Allergies: Patient has no known allergies.     Review of Systems  Constitutional:  Negative for fever.  Gastrointestinal:  Positive for abdominal pain. Negative for blood in stool and vomiting.    Updated Vital Signs BP 117/71   Pulse 74   Temp 98 F (36.7 C) (Oral)   Resp 16   SpO2 100%   Physical Exam CONSTITUTIONAL: Well developed/well nourished HEAD: Normocephalic/atraumatic EYES: EOMI/PERRL, no icterus ENMT: Mucous membranes moist NECK: supple no meningeal signs CV: S1/S2 noted, no murmurs/rubs/gallops noted LUNGS: Lungs are clear to auscultation bilaterally, no apparent distress ABDOMEN: soft, nontender, no rebound or guarding, bowel sounds noted throughout abdomen GU:no cva tenderness NEURO: Pt is awake/alert/appropriate, moves all extremitiesx4.  No facial droop.   SKIN: warm, color normal PSYCH: no abnormalities of mood noted, alert and oriented to situation  (all labs ordered are listed, but only abnormal results are displayed) Labs Reviewed - No data to display  EKG: None  Radiology: No results found.   Procedures   Medications Ordered in the ED - No data to display  Clinical Course as of 04/30/24 0030  Sun Apr 30, 2024  0014 Patient is appropriate for d/c home.  I doubt acute abdominal emergency at this time.  We discussed strict ER return precautions including abdominal pain that migrates to RLQ, fever >100.41F with repetitive vomiting over next 8-12 hours [DW]    Clinical Course User Index [DW] Midge Golas, MD  Medical Decision Making  This patient presents to the ED for concern of abdominal pain, this involves an extensive number of treatment options, and is a complaint that carries with it a high risk of complications and morbidity.  The differential diagnosis includes but is not limited to cholecystitis, cholelithiasis, pancreatitis, gastritis, peptic ulcer disease, appendicitis, bowel obstruction, bowel perforation, diverticulitis    Comorbidities that  complicate the patient evaluation: Patient's presentation is complicated by their history of multiple sclerosis  Additional history obtained: Additional history obtained from family  Test Considered: CT imaging was considered, but since patient is pain-free will defer at this time  Complexity of problems addressed: Patient's presentation is most consistent with  acute, uncomplicated illness  Disposition: After consideration of the diagnostic results and the patient's response to treatment,  I feel that the patent would benefit from discharge  .    Patient pain-free, no acute distress.  He is requesting discharge home.  I advised we should at least monitor him for an hour and ensure he can take p.o. but he declined.  Patient will be discharged    Final diagnoses:  Generalized abdominal pain    ED Discharge Orders     None          Midge Golas, MD 04/30/24 [phone]

## 2024-07-19 ENCOUNTER — Encounter: Payer: Self-pay | Admitting: Neurology
